# Patient Record
Sex: Female | Born: 1980 | Race: White | Hispanic: No | Smoking: Current every day smoker
Health system: Southern US, Community
[De-identification: ages and names within clinical notes are randomized; demographics above are authoritative.]

## PROBLEM LIST (undated history)

## (undated) DIAGNOSIS — F319 Bipolar disorder, unspecified: Secondary | ICD-10-CM

## (undated) DIAGNOSIS — F419 Anxiety disorder, unspecified: Secondary | ICD-10-CM

## (undated) DIAGNOSIS — J45909 Unspecified asthma, uncomplicated: Secondary | ICD-10-CM

## (undated) DIAGNOSIS — F32A Depression, unspecified: Secondary | ICD-10-CM

## (undated) DIAGNOSIS — F329 Major depressive disorder, single episode, unspecified: Secondary | ICD-10-CM

## (undated) HISTORY — DX: Depression, unspecified: F32.A

## (undated) HISTORY — DX: Bipolar disorder, unspecified: F31.9

## (undated) HISTORY — DX: Major depressive disorder, single episode, unspecified: F32.9

## (undated) HISTORY — DX: Anxiety disorder, unspecified: F41.9

---
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
## 2002-01-13 ENCOUNTER — Emergency Department (HOSPITAL_COMMUNITY): Admission: EM | Admit: 2002-01-13 | Discharge: 2002-01-13 | Payer: Self-pay

## 2004-01-22 ENCOUNTER — Other Ambulatory Visit: Admission: RE | Admit: 2004-01-22 | Discharge: 2004-01-22 | Payer: Self-pay | Admitting: Gynecology

## 2004-02-09 ENCOUNTER — Ambulatory Visit: Payer: Self-pay | Admitting: Psychiatry

## 2004-02-09 ENCOUNTER — Inpatient Hospital Stay (HOSPITAL_COMMUNITY): Admission: EM | Admit: 2004-02-09 | Discharge: 2004-02-13 | Payer: Self-pay | Admitting: Psychiatry

## 2004-04-22 ENCOUNTER — Ambulatory Visit (HOSPITAL_COMMUNITY): Payer: Self-pay | Admitting: Professional Counselor

## 2004-05-29 ENCOUNTER — Ambulatory Visit (HOSPITAL_COMMUNITY): Payer: Self-pay | Admitting: Psychiatry

## 2004-12-24 ENCOUNTER — Emergency Department (HOSPITAL_COMMUNITY): Admission: EM | Admit: 2004-12-24 | Discharge: 2004-12-24 | Payer: Self-pay | Admitting: Emergency Medicine

## 2005-02-22 ENCOUNTER — Emergency Department (HOSPITAL_COMMUNITY): Admission: EM | Admit: 2005-02-22 | Discharge: 2005-02-22 | Payer: Self-pay | Admitting: Emergency Medicine

## 2005-03-08 ENCOUNTER — Emergency Department (HOSPITAL_COMMUNITY): Admission: EM | Admit: 2005-03-08 | Discharge: 2005-03-08 | Payer: Self-pay | Admitting: Emergency Medicine

## 2005-03-09 ENCOUNTER — Inpatient Hospital Stay (HOSPITAL_COMMUNITY): Admission: AD | Admit: 2005-03-09 | Discharge: 2005-03-09 | Payer: Self-pay | Admitting: *Deleted

## 2005-03-11 ENCOUNTER — Inpatient Hospital Stay (HOSPITAL_COMMUNITY): Admission: AD | Admit: 2005-03-11 | Discharge: 2005-03-11 | Payer: Self-pay | Admitting: *Deleted

## 2005-04-07 ENCOUNTER — Other Ambulatory Visit: Admission: RE | Admit: 2005-04-07 | Discharge: 2005-04-07 | Payer: Self-pay | Admitting: Obstetrics & Gynecology

## 2005-04-09 ENCOUNTER — Emergency Department (HOSPITAL_COMMUNITY): Admission: EM | Admit: 2005-04-09 | Discharge: 2005-04-10 | Payer: Self-pay | Admitting: Emergency Medicine

## 2005-04-12 ENCOUNTER — Other Ambulatory Visit: Admission: RE | Admit: 2005-04-12 | Discharge: 2005-04-12 | Payer: Self-pay | Admitting: Obstetrics & Gynecology

## 2005-05-03 ENCOUNTER — Inpatient Hospital Stay (HOSPITAL_COMMUNITY): Admission: AD | Admit: 2005-05-03 | Discharge: 2005-05-04 | Payer: Self-pay | Admitting: Obstetrics & Gynecology

## 2005-05-22 ENCOUNTER — Inpatient Hospital Stay (HOSPITAL_COMMUNITY): Admission: AD | Admit: 2005-05-22 | Discharge: 2005-05-23 | Payer: Self-pay | Admitting: Obstetrics and Gynecology

## 2005-07-04 ENCOUNTER — Inpatient Hospital Stay (HOSPITAL_COMMUNITY): Admission: AD | Admit: 2005-07-04 | Discharge: 2005-07-04 | Payer: Self-pay | Admitting: Obstetrics & Gynecology

## 2005-07-16 ENCOUNTER — Inpatient Hospital Stay (HOSPITAL_COMMUNITY): Admission: AD | Admit: 2005-07-16 | Discharge: 2005-07-16 | Payer: Self-pay | Admitting: Obstetrics and Gynecology

## 2005-09-15 ENCOUNTER — Inpatient Hospital Stay (HOSPITAL_COMMUNITY): Admission: AD | Admit: 2005-09-15 | Discharge: 2005-09-15 | Payer: Self-pay | Admitting: Obstetrics and Gynecology

## 2005-09-28 ENCOUNTER — Inpatient Hospital Stay (HOSPITAL_COMMUNITY): Admission: AD | Admit: 2005-09-28 | Discharge: 2005-09-29 | Payer: Self-pay | Admitting: Obstetrics and Gynecology

## 2005-10-28 ENCOUNTER — Inpatient Hospital Stay (HOSPITAL_COMMUNITY): Admission: AD | Admit: 2005-10-28 | Discharge: 2005-10-31 | Payer: Self-pay | Admitting: Obstetrics and Gynecology

## 2005-12-07 ENCOUNTER — Emergency Department (HOSPITAL_COMMUNITY): Admission: EM | Admit: 2005-12-07 | Discharge: 2005-12-07 | Payer: Self-pay | Admitting: Emergency Medicine

## 2006-08-27 ENCOUNTER — Emergency Department (HOSPITAL_COMMUNITY): Admission: EM | Admit: 2006-08-27 | Discharge: 2006-08-27 | Payer: Self-pay | Admitting: Emergency Medicine

## 2006-09-22 ENCOUNTER — Emergency Department (HOSPITAL_COMMUNITY): Admission: EM | Admit: 2006-09-22 | Discharge: 2006-09-22 | Payer: Self-pay | Admitting: Emergency Medicine

## 2006-09-26 ENCOUNTER — Emergency Department (HOSPITAL_COMMUNITY): Admission: EM | Admit: 2006-09-26 | Discharge: 2006-09-26 | Payer: Self-pay | Admitting: Emergency Medicine

## 2006-11-25 ENCOUNTER — Ambulatory Visit: Payer: Self-pay | Admitting: Internal Medicine

## 2006-12-16 ENCOUNTER — Ambulatory Visit: Payer: Self-pay | Admitting: Internal Medicine

## 2006-12-16 ENCOUNTER — Encounter (INDEPENDENT_AMBULATORY_CARE_PROVIDER_SITE_OTHER): Payer: Self-pay | Admitting: Nurse Practitioner

## 2006-12-16 LAB — CONVERTED CEMR LAB: Urinalysis: NORMAL

## 2006-12-30 ENCOUNTER — Encounter: Payer: Self-pay | Admitting: Nurse Practitioner

## 2006-12-30 DIAGNOSIS — M545 Low back pain: Secondary | ICD-10-CM

## 2007-01-02 DIAGNOSIS — B977 Papillomavirus as the cause of diseases classified elsewhere: Secondary | ICD-10-CM

## 2007-03-11 ENCOUNTER — Emergency Department (HOSPITAL_COMMUNITY): Admission: EM | Admit: 2007-03-11 | Discharge: 2007-03-11 | Payer: Self-pay | Admitting: Family Medicine

## 2007-03-30 ENCOUNTER — Emergency Department (HOSPITAL_COMMUNITY): Admission: EM | Admit: 2007-03-30 | Discharge: 2007-03-30 | Payer: Self-pay | Admitting: Emergency Medicine

## 2007-08-15 ENCOUNTER — Emergency Department (HOSPITAL_COMMUNITY): Admission: EM | Admit: 2007-08-15 | Discharge: 2007-08-16 | Payer: Self-pay | Admitting: Emergency Medicine

## 2007-08-26 ENCOUNTER — Emergency Department (HOSPITAL_COMMUNITY): Admission: EM | Admit: 2007-08-26 | Discharge: 2007-08-27 | Payer: Self-pay | Admitting: Emergency Medicine

## 2007-08-30 ENCOUNTER — Emergency Department (HOSPITAL_COMMUNITY): Admission: EM | Admit: 2007-08-30 | Discharge: 2007-08-30 | Payer: Self-pay | Admitting: Emergency Medicine

## 2007-09-26 ENCOUNTER — Ambulatory Visit (HOSPITAL_COMMUNITY): Admission: RE | Admit: 2007-09-26 | Discharge: 2007-09-26 | Payer: Self-pay | Admitting: Obstetrics & Gynecology

## 2007-09-29 ENCOUNTER — Inpatient Hospital Stay (HOSPITAL_COMMUNITY): Admission: AD | Admit: 2007-09-29 | Discharge: 2007-09-29 | Payer: Self-pay | Admitting: Obstetrics and Gynecology

## 2007-10-09 ENCOUNTER — Emergency Department (HOSPITAL_COMMUNITY): Admission: EM | Admit: 2007-10-09 | Discharge: 2007-10-09 | Payer: Self-pay | Admitting: Emergency Medicine

## 2008-04-09 ENCOUNTER — Inpatient Hospital Stay (HOSPITAL_COMMUNITY): Admission: AD | Admit: 2008-04-09 | Discharge: 2008-04-11 | Payer: Self-pay | Admitting: Obstetrics and Gynecology

## 2008-05-24 IMAGING — US US OB TRANSVAGINAL
1 series · 14 of 25 positions shown · non-contrast
Comparison: none

OBSTETRICAL ULTRASOUND:
 This ultrasound exam was performed in the [HOSPITAL] Ultrasound Department.  The OB US report was generated in the AS system, and faxed to the ordering physician.  This report is also available in [REDACTED] PACS.

[Series 1: us ob transvaginal · 0.18mm/px · 14 of 25 slices shown]
[im 1/25]
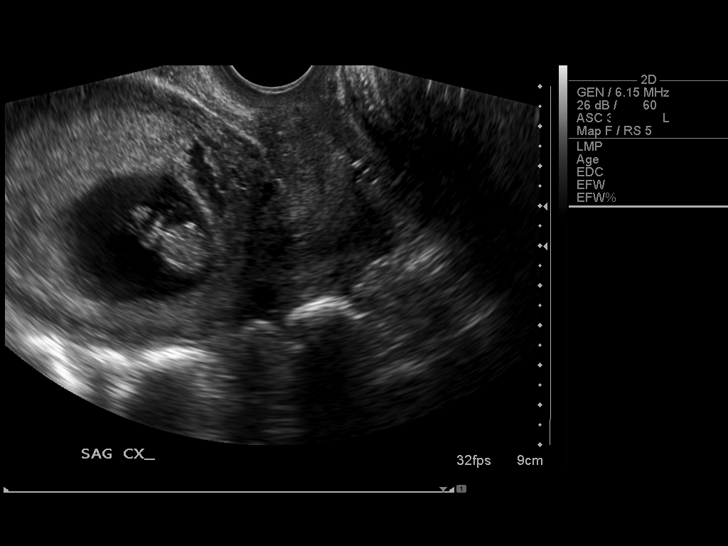
[im 3/25]
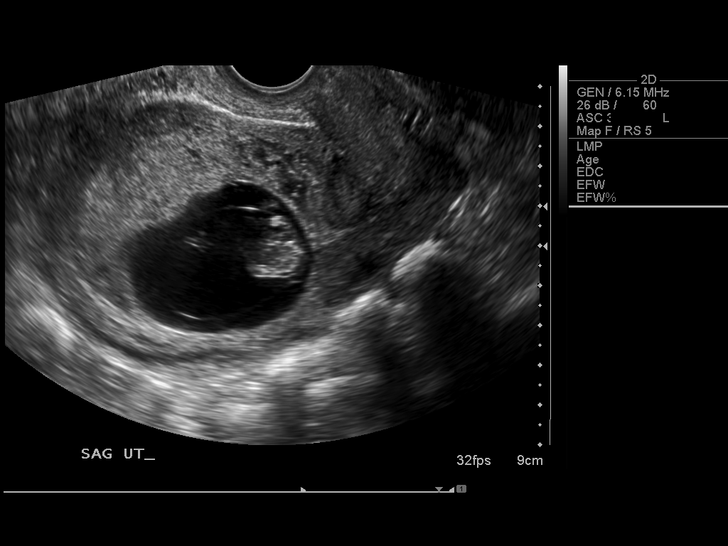
[im 5/25]
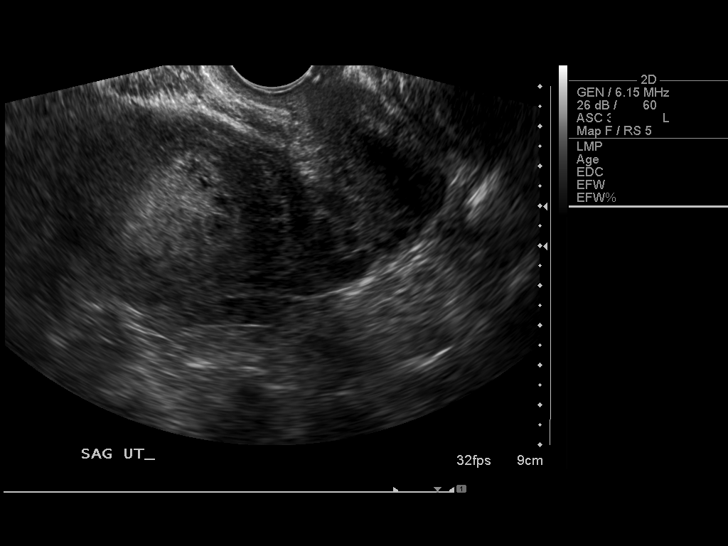
[im 7/25]
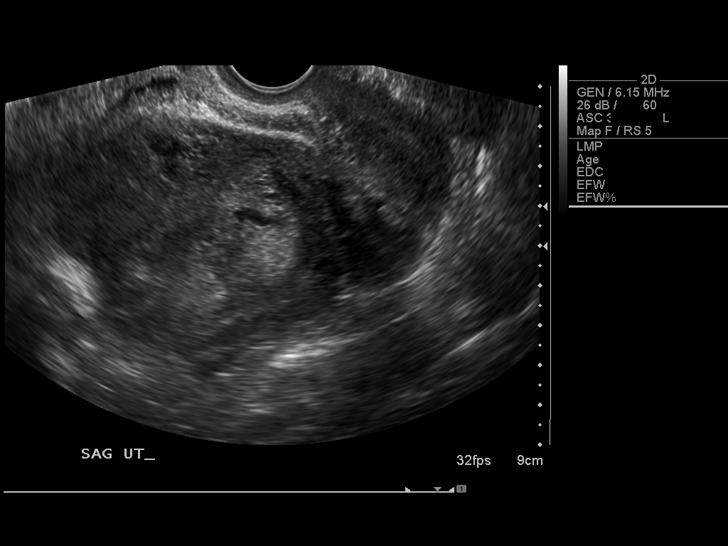
[im 9/25]
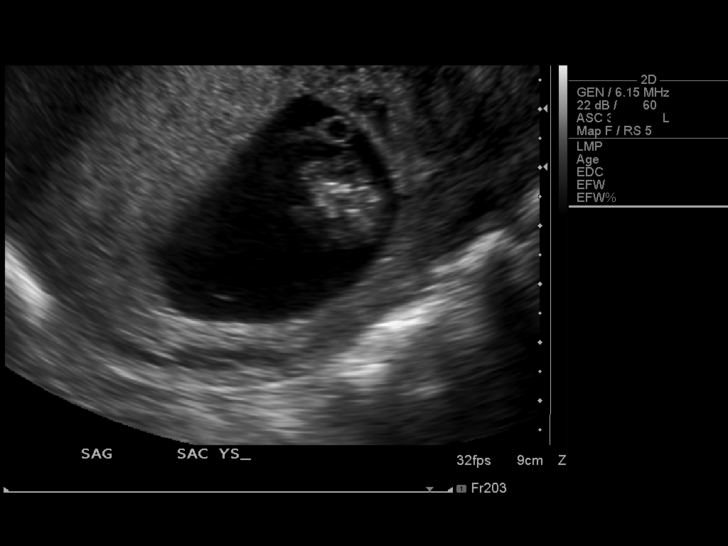
[im 10/25]
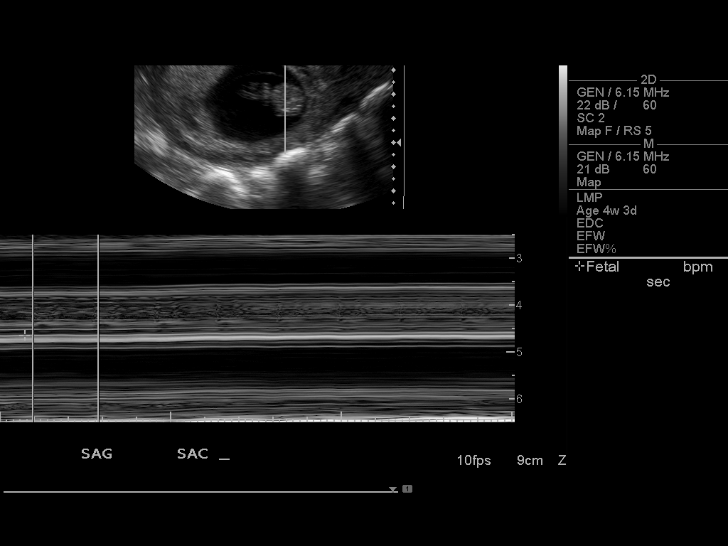
[im 12/25]
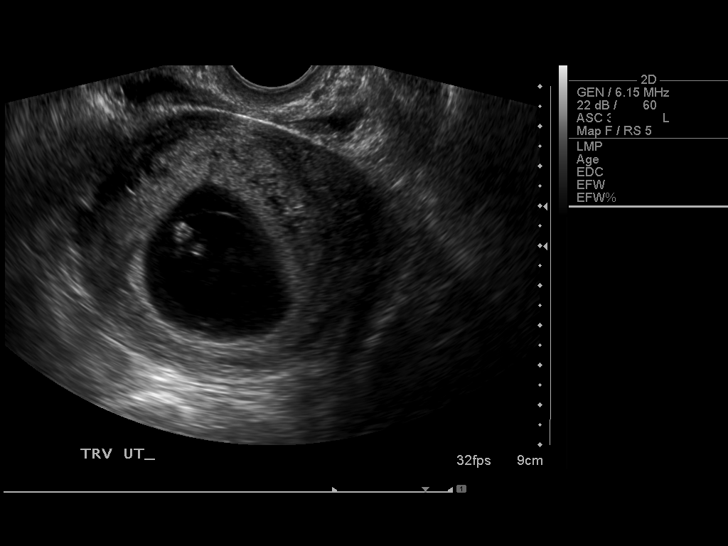
[im 14/25]
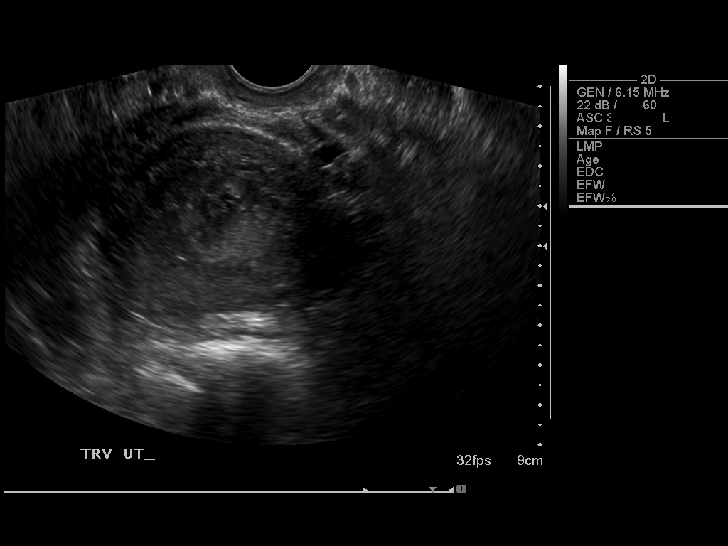
[im 16/25]
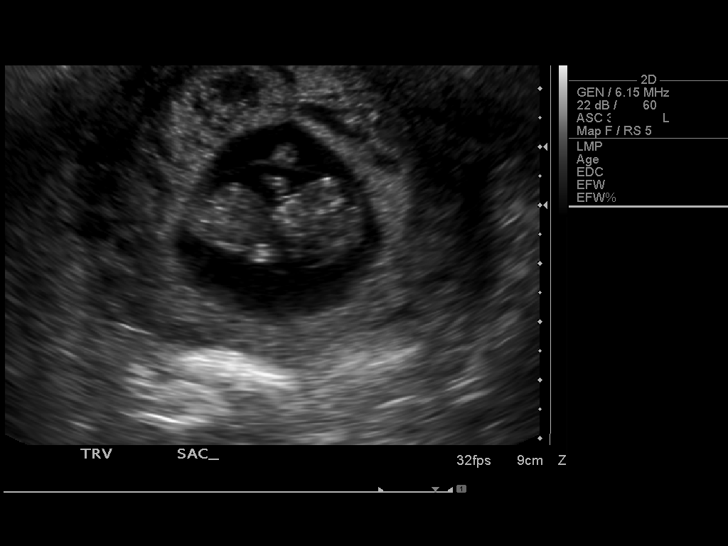
[im 17/25]
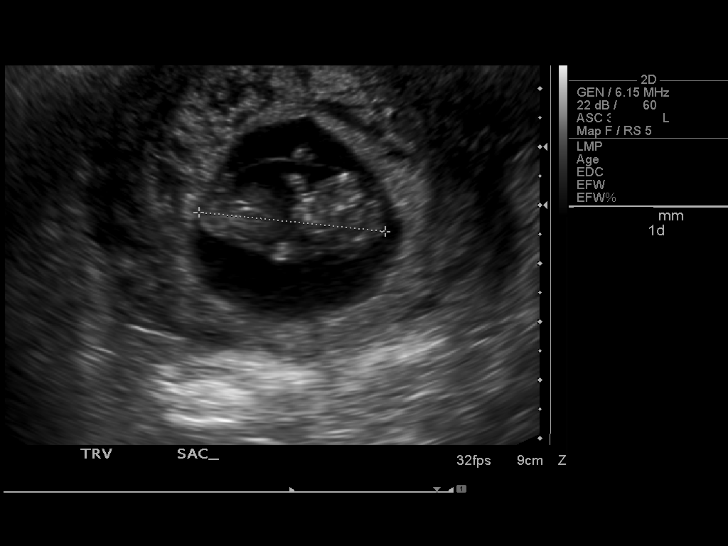
[im 19/25]
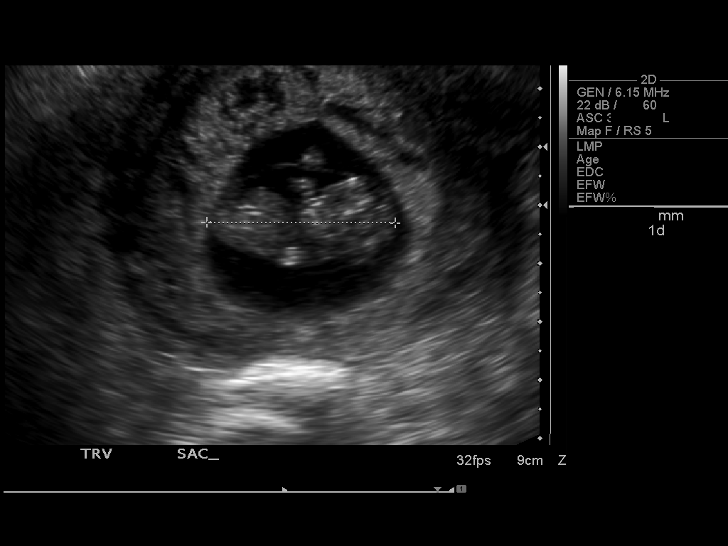
[im 21/25]
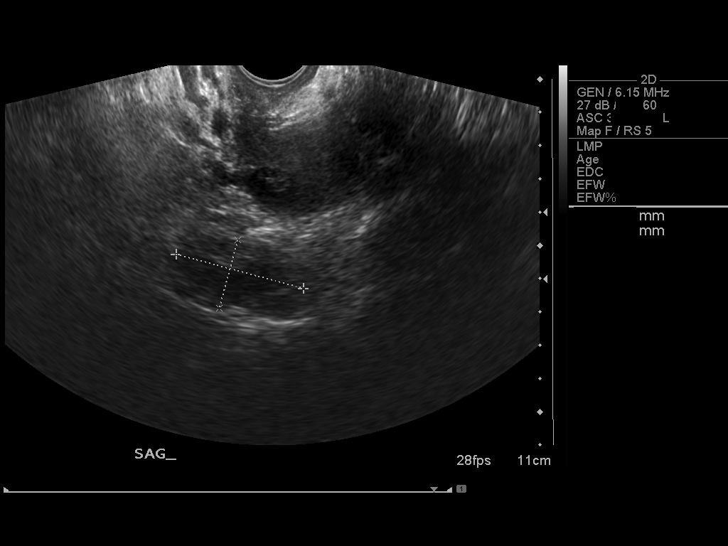
[im 23/25]
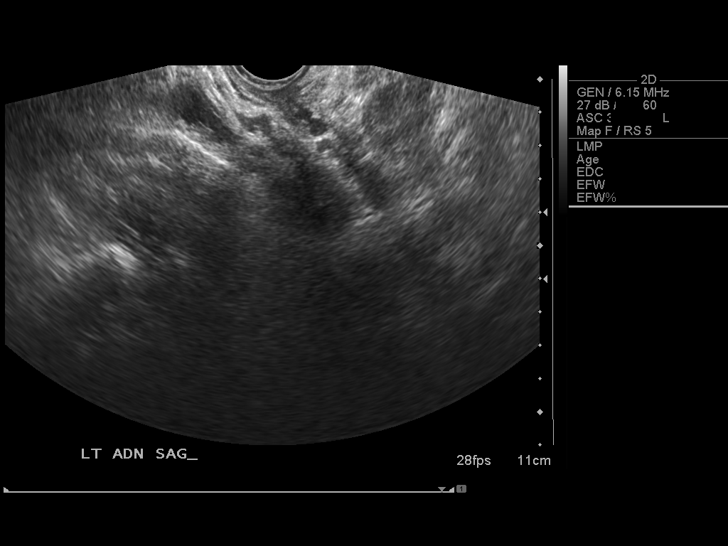
[im 25/25]
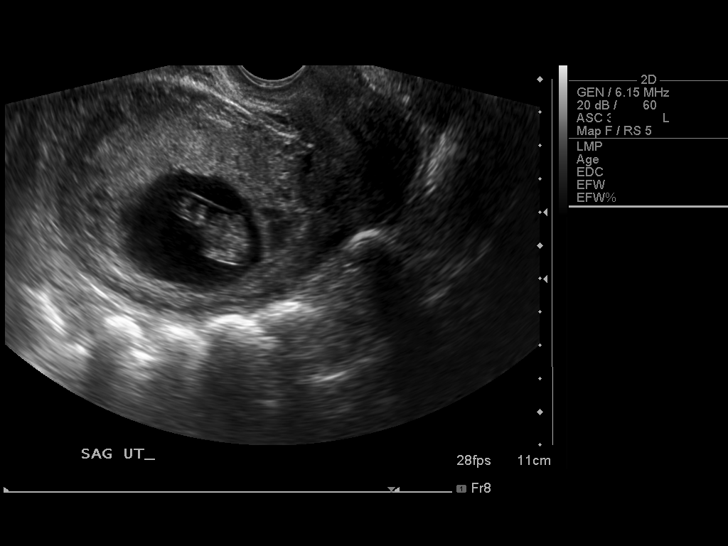

[14 of 25 positions shown; findings below may reference images not displayed]

IMPRESSION: See AS Obstetric US report.

## 2008-06-06 IMAGING — US US OB COMP LESS 14 WK
1 series · 14 of 28 positions shown · non-contrast
Comparison: Ultrasound 09/26/2006

CLINICAL DATA: Neck female status post fall with lower abdominal
pain

OBSTETRIC <14 WK ULTRASOUND
TECHNIQUE: Transabdominal ultrasound was performed for evaluation
of the gestation as well as the maternal uterus and adnexal
regions.

[Series 1: unknown · 0.32mm/px · 14 of 41 slices shown]
[im 2/41]
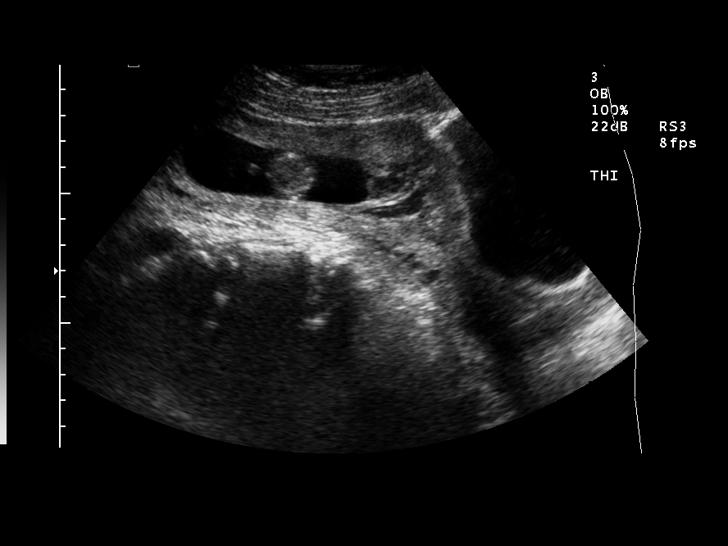
[im 5/41]
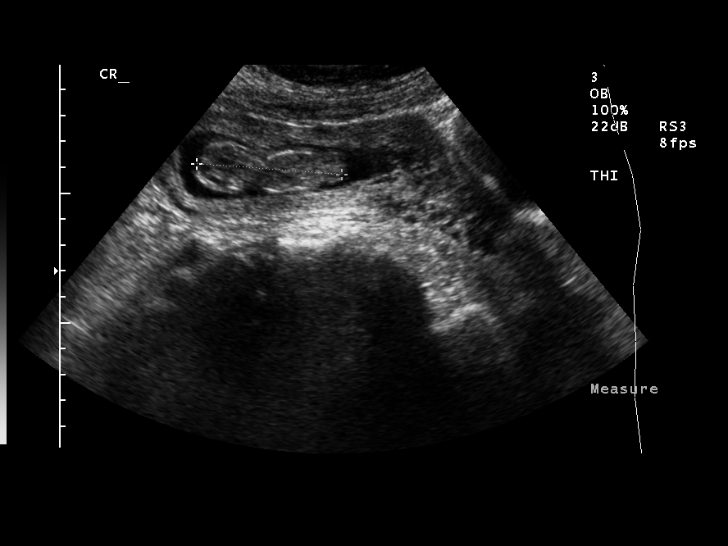
[im 8/41]
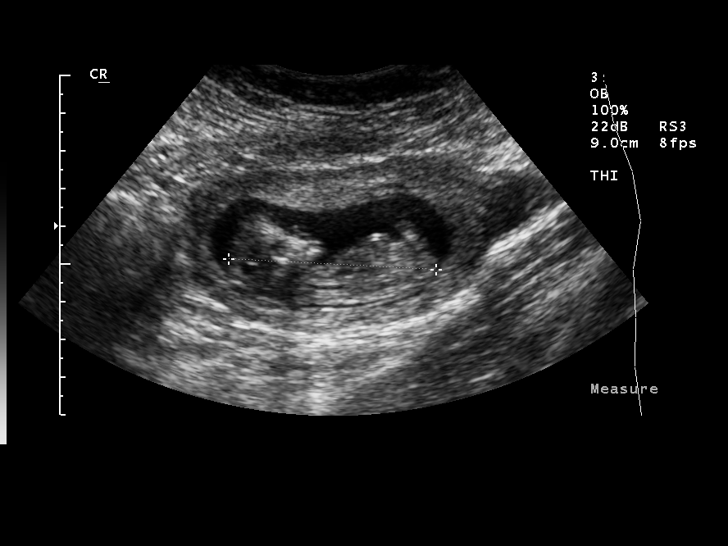
[im 11/41]
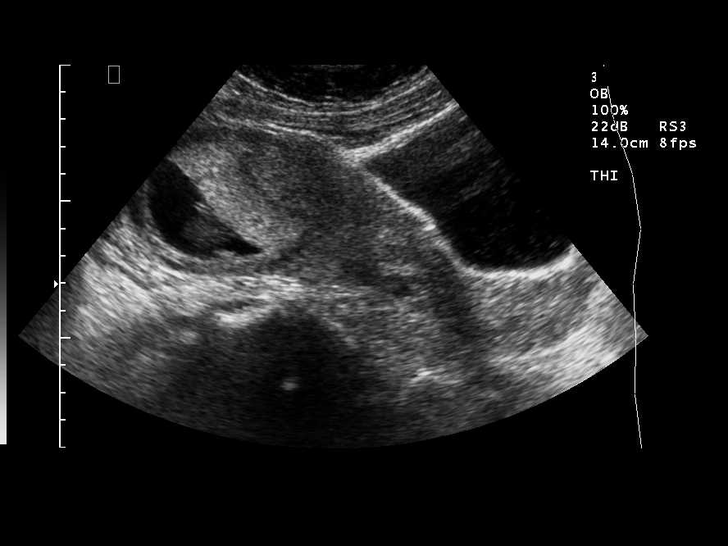
[im 14/41]
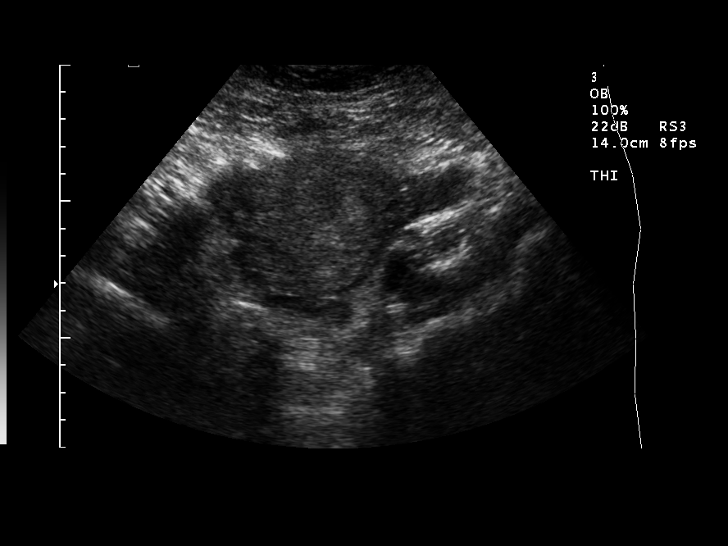
[im 17/41]
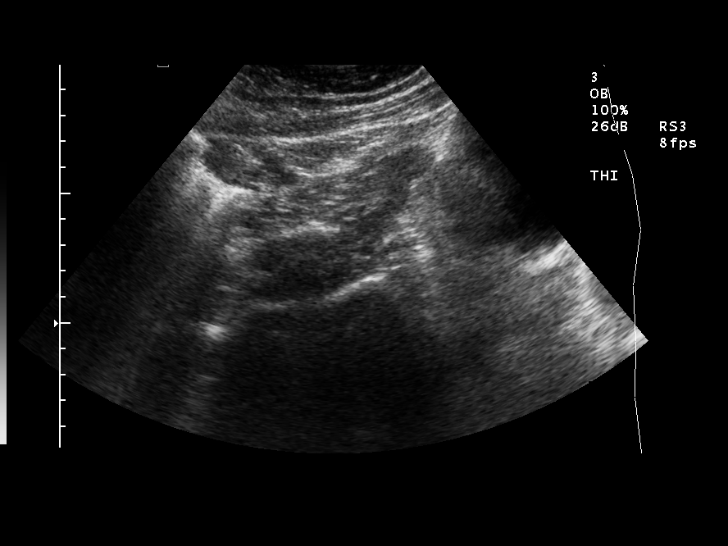
[im 20/41]
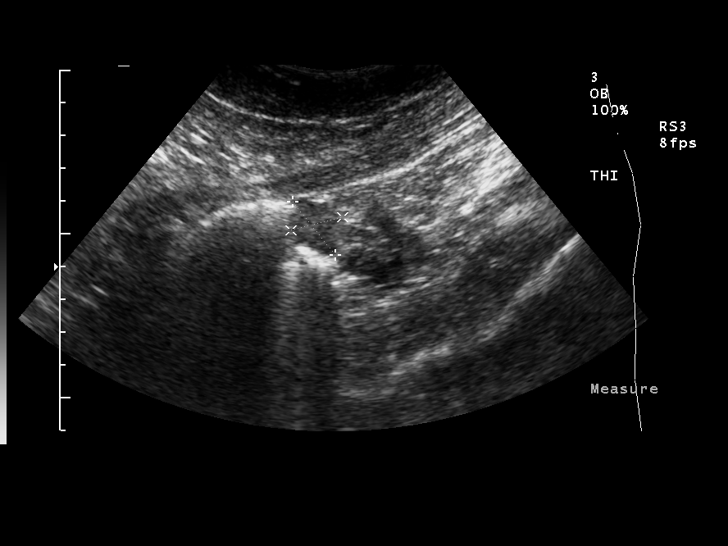
[im 23/41]
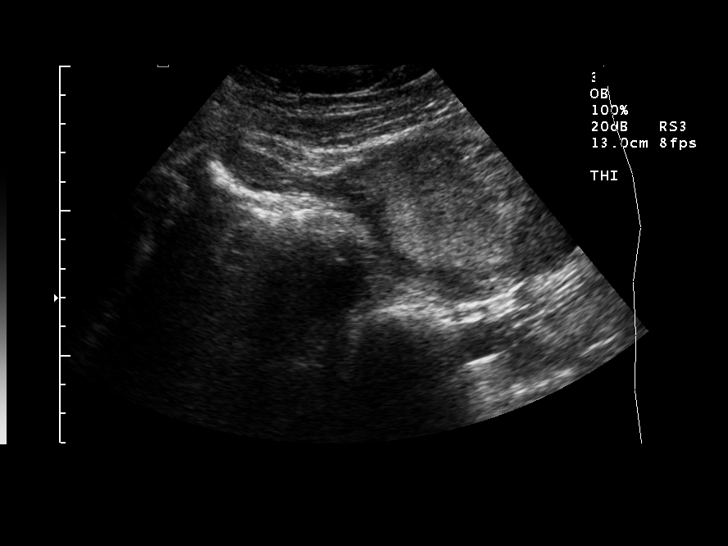
[im 26/41]
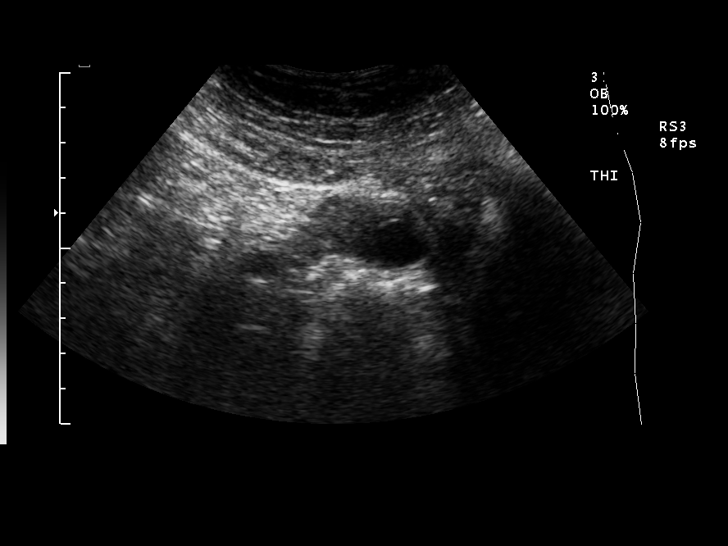
[im 29/41]
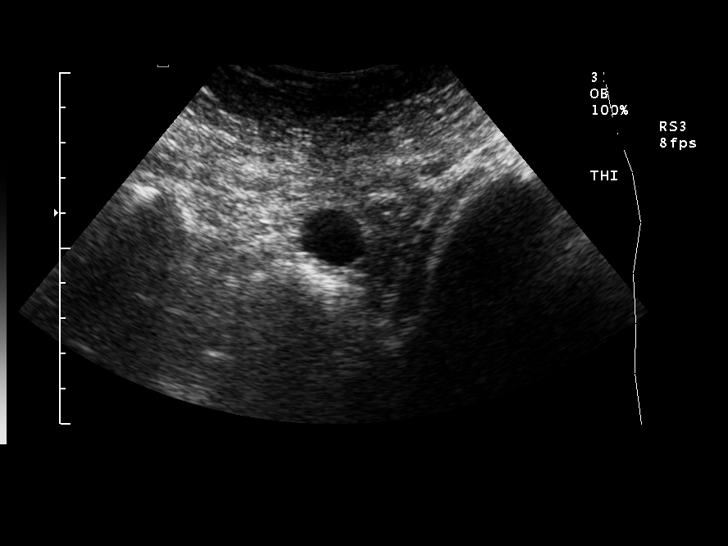
[im 32/41]
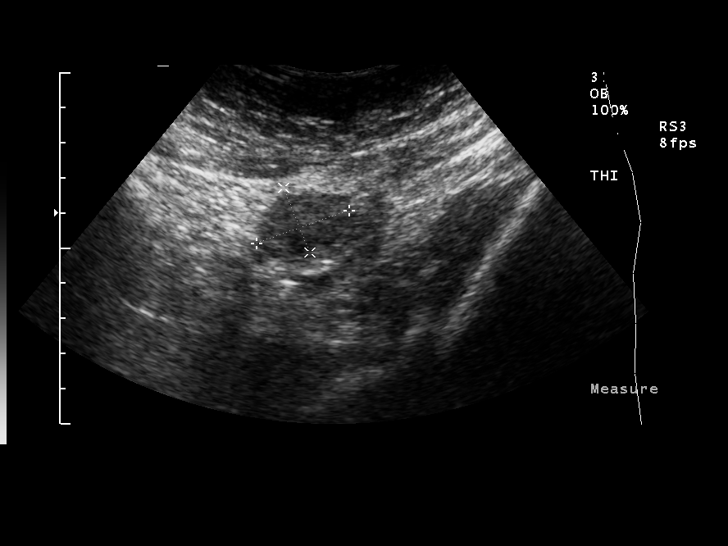
[im 35/41]
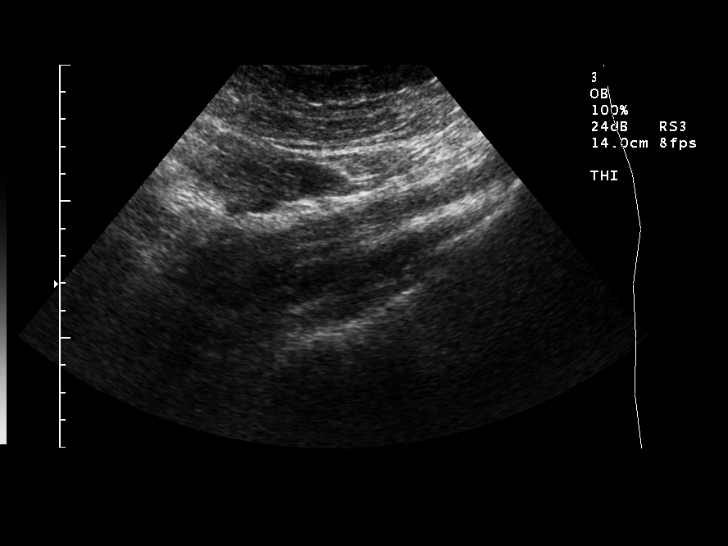
[im 38/41]
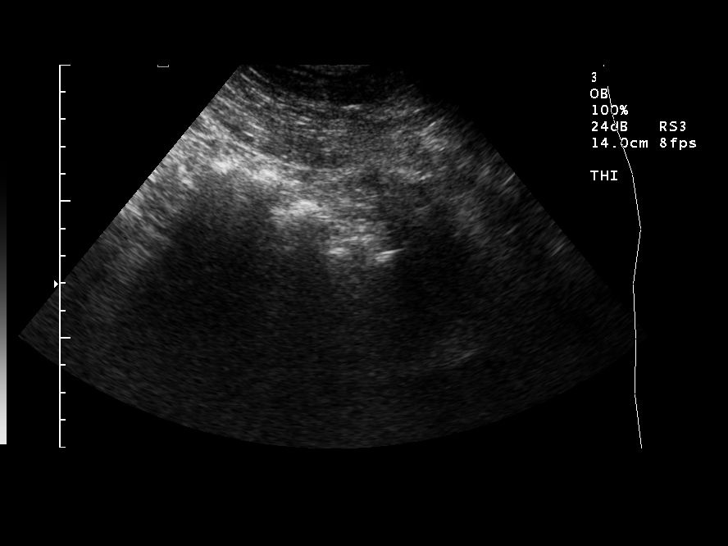
[im 41/41]
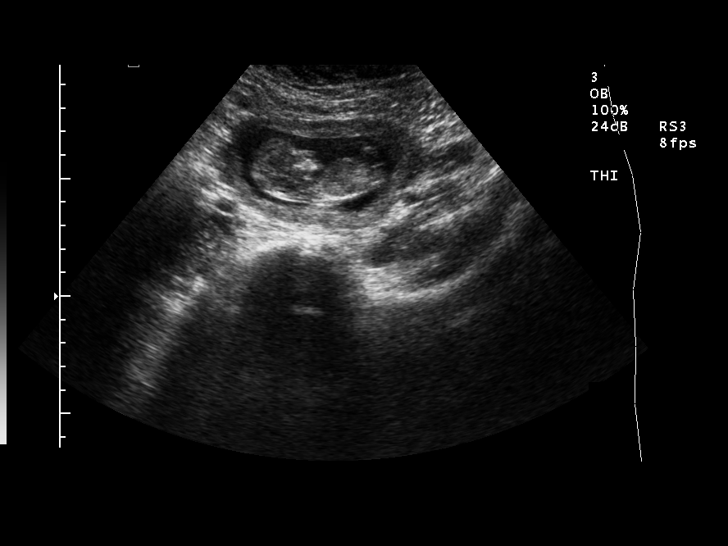

[14 of 28 positions shown; findings below may reference images not displayed]

FINDINGS: There is a single living intrauterine pregnancy with a
crown rump length of 5.5 cm for estimated gestational age of 12
weeks 1 day.  Heart rate is 160 beats per minute.  No subchorionic
hemorrhage is identified.  Right ovary is unremarkable.  Small left
ovarian cyst measure approximate 1.8 cm noted.  No pelvic fluid.
IMPRESSION: Single living intrauterine pregnancy without complicating features.

## 2010-06-07 ENCOUNTER — Encounter: Payer: Self-pay | Admitting: Obstetrics & Gynecology

## 2010-10-02 NOTE — Discharge Summary (Signed)
NAMELANGLEY, FLATLEY NO.:  [id number]   MEDICAL RECORD NO.:  [id number]          PATIENT TYPE:  IPS   LOCATION:  0303                          FACILITY:  [facility]   PHYSICIAN:  Geoffery Lyons, M.D.      DATE OF BIRTH:  May 03, 1981   DATE OF ADMISSION:  02/09/2004  DATE OF DISCHARGE:  02/13/2004                                 DISCHARGE SUMMARY   CHIEF COMPLAINT AND PRESENT ILLNESS:  This was the first admission to [facility] for this 30 year old single white female voluntarily  admitted.  History of depression, mood problems, hearing voices, felt  hopeless, depressed, chronic conflict with the mother with whom she lives.  Also endorsed that a friend killed self via a gunshot wound two weeks prior  to this admission.  Endorsed racing thoughts, voices hammering her and some  commands to cut.  Decreased sleep, two hours a night for three weeks,  irritable, agitated thoughts.   PAST PSYCHIATRIC HISTORY:  Sees Dr. Mila Homer.  First time at [facility].  Had been in the adolescent unit.  History of suicide attempts but cutting.   MEDICATIONS:  Paxil, Wellbutrin, Celexa, Lexapro, lithium, Seroquel with  poor compliance or side effects or lack of effectiveness.   ALCOHOL/DRUG HISTORY:  Cocaine and methamphetamines.  Clean for 1-1/2 years.   PAST MEDICAL HISTORY:  Menorrhea, headaches, bacterial vaginal infection.   MEDICATIONS:  Lexapro 10 mg per day.   PHYSICAL EXAMINATION:  Performed and failed to show any acute findings.   LABORATORY DATA:  CBC with white blood cells 14.4, hemoglobin 14.8.  Blood  chemistry within normal limits.  TSH 1.013.  Drug screen negative for  substances of abuse.   MENTAL STATUS EXAM:  Fully alert, pleasant, cooperative female with somewhat  blunted affect.  Constricted but appropriate.  Speech normal rate, tempo and  production.  Mood depressed.  Thought processes logical, coherent and  relevant.  Suicidal ideation with  thoughts of cutting.  Positive for  auditory hallucinations, commands.  Promises safety.  Cognition was well-  preserved.   ADMISSION DIAGNOSES:   AXIS I:  Rule out bipolar disorder, depressed, with psychotic features.   AXIS II:  No diagnosis.   AXIS III:  Headaches.   AXIS IV:  Moderate.   AXIS V:  Global Assessment of Functioning upon admission 28; highest Global  Assessment of Functioning in the last year 69.   HOSPITAL COURSE:  She was admitted and started in individual and group  psychotherapy.  She was given Ambien for sleep and she was placed on  Risperdal 0.25 mg twice a day and she was given Klonopin 0.5 mg every six  hours as needed, Klonopin 1 mg at 9 p.m.  She was switched to Risperdal 0.25  mg at noon and at lunch and 0.5 mg at bedtime and to Klonopin 0.5 mg at  night.  She endorsed having a difficult time with light-headedness.  Had to  slide down to the floor.  Had not slept in few days before she came to the  unit.  The fact that she slept was making her feel a little better.  Endorsed chronic conflict with the mother or something to do with the death  of the friend.  Diagnosed with bipolar, recurrent, depression.  Discussed  options and we decided to pursue Wellbutrin XL 150 mg in the morning and  Lamictal 25 mg daily.  She endorsed starting to feel better.  Continued to  sleep better.  Was going to have a session with her mother in the evening  and she would like to see Dr. Katrinka Blazing for follow-up.  The session with the  mother went well.  Overall, her mood was better.  Her affect was bright,  broad.  There was no evidence of delusional ideas, suicidal ideation,  homicidal ideation.  She was feeling that she could go home safely.  As she  was indeed much better, we went ahead and discharged to outpatient follow-  up.   DISCHARGE DIAGNOSES:   AXIS I:  Bipolar disorder, depressed with psychotic features.   AXIS II:  No diagnosis.   AXIS III:  Headaches.    AXIS IV:  Moderate.   AXIS V:  Global Assessment of Functioning upon discharge 50.   DISCHARGE MEDICATIONS:  1.  Risperdal M-Tab 0.5 mg at night and 0.25 mg at noon.  2.  Klonopin 0.5 mg at bedtime.  3.  Wellbutrin XL 150 mg in the morning.  4.  Lamictal 25 mg, 1 daily.  5.  Ambien 10 mg at bedtime for sleep.   FOLLOW UP:  Dr. Milford Cage.     Farrel Gordon   IL/MEDQ  D:  03/03/2004  T:  03/04/2004  Job:  [id number]

## 2010-10-02 NOTE — Discharge Summary (Signed)
Nancy Hartman, Nancy Hartman                  ACCOUNT NO.:  [id number]   MEDICAL RECORD NO.:  [id number]          PATIENT TYPE:  INP   LOCATION:  9127                          FACILITY:  [facility]   PHYSICIAN:  Gerrit Friends. Aldona Bar, M.D.   DATE OF BIRTH:  1980-06-07   DATE OF ADMISSION:  10/28/2005  DATE OF DISCHARGE:  10/31/2005                                 DISCHARGE SUMMARY   DISCHARGE DIAGNOSIS:  1.  Term pregnancy delivered 6 pound female infant, Apgars 9 and 9.  2.  Blood type O+.   PROCEDURES:  1.  Normal spontaneous delivery.  2.  Vaginal tear repaired.   SUMMARY:  This 30 year old gravida 1, para 0 was admitted at term with  ruptured membranes.  She has a history of a bipolar disorder, but otherwise  her pregnancy was relatively uncomplicated.  She progressed in labor--  required Pitocin augmentation.  Requested and received an epidural; and  subsequent had a normal spontaneous delivery of viable 6 pound female infant  with good Apgars.  A small vaginal tear was repaired.   Her postpartum course was totally benign.  Her discharge hemoglobin was 10.2  with a white count 15,500 and a platelet count 252,000.  On the morning of  06/17 she was ambulating well, tolerating a regular diet well, having normal  bowel and bladder function and was afebrile.  Her breast-feeding was going  well; and she was desirous of discharge.  Accordingly, she was given  appropriate instructions and understood all instructions well.   DISCHARGE MEDICATIONS INCLUDE:  1.  Vitamins--1 a day.  2.  Feosol capsules--one 4-5x a week.  3.  Motrin 600 mg q.6 h. for pain or cramps.  4.  Tylox 1-2 q.4 h. for more severe pain.   She will return the office in 4 weeks for followup or as needed.   CONDITION ON DISCHARGE:  Improved.      Gerrit Friends. Aldona Bar, M.D.  Electronically Signed     RMW/MEDQ  D:  10/31/2005  T:  11/01/2005  Job:  [id number]

## 2011-02-08 LAB — URINALYSIS, ROUTINE W REFLEX MICROSCOPIC
Bilirubin Urine: NEGATIVE
Glucose, UA: NEGATIVE
Hgb urine dipstick: NEGATIVE
Ketones, ur: NEGATIVE
Nitrite: NEGATIVE
Protein, ur: NEGATIVE
Specific Gravity, Urine: 1.027
Urobilinogen, UA: 1
pH: 6.5

## 2011-02-08 LAB — POCT PREGNANCY, URINE
Operator id: 27065
Operator id: 27065
Preg Test, Ur: NEGATIVE
Preg Test, Ur: POSITIVE

## 2011-02-09 LAB — DIFFERENTIAL
Basophils Absolute: 0.1
Basophils Relative: 1
Eosinophils Absolute: 0.4
Eosinophils Absolute: 0.1
Eosinophils Relative: 3
Lymphs Abs: 2.8
Monocytes Relative: 3
Neutrophils Relative %: 82 — ABNORMAL HIGH

## 2011-02-09 LAB — WET PREP, GENITAL
Trich, Wet Prep: NONE SEEN
Yeast Wet Prep HPF POC: NONE SEEN

## 2011-02-09 LAB — URINE MICROSCOPIC-ADD ON

## 2011-02-09 LAB — CBC
HCT: 36.3
MCHC: 35
MCHC: 33.6
MCV: 83.8
MCV: 84.6
Platelets: 258
Platelets: 286
RDW: 14.7
WBC: 14.2 — ABNORMAL HIGH

## 2011-02-09 LAB — POCT I-STAT, CHEM 8
Glucose, Bld: 129 — ABNORMAL HIGH
HCT: 37
Hemoglobin: 12.6
Potassium: 4.1
Sodium: 139

## 2011-02-09 LAB — URINALYSIS, ROUTINE W REFLEX MICROSCOPIC
Bilirubin Urine: NEGATIVE
Glucose, UA: NEGATIVE
Hgb urine dipstick: NEGATIVE
Specific Gravity, Urine: 1.023
pH: 5.5

## 2011-02-09 LAB — HCG, QUANTITATIVE, PREGNANCY: hCG, Beta Chain, Quant, S: 11643 — ABNORMAL HIGH

## 2011-02-09 LAB — GC/CHLAMYDIA PROBE AMP, GENITAL
Chlamydia, DNA Probe: NEGATIVE
GC Probe Amp, Genital: NEGATIVE

## 2011-02-09 LAB — POCT CARDIAC MARKERS: CKMB, poc: <1 — ABNORMAL LOW

## 2011-02-09 LAB — ABO/RH: ABO/RH(D): O POS

## 2011-02-10 LAB — URINALYSIS, ROUTINE W REFLEX MICROSCOPIC
Bilirubin Urine: NEGATIVE
Hgb urine dipstick: NEGATIVE
Nitrite: NEGATIVE
Specific Gravity, Urine: 1.021
Specific Gravity, Urine: 1.02
Urobilinogen, UA: 0.2
pH: 6
pH: 5.5

## 2011-02-10 LAB — CBC
HCT: 35.8 — ABNORMAL LOW
Hemoglobin: 12.1
Hemoglobin: 12.1
MCHC: 34.5
MCV: 84.9
Platelets: 226
RBC: 4.14
WBC: 12.1 — ABNORMAL HIGH

## 2011-02-10 LAB — BASIC METABOLIC PANEL
BUN: 5 — ABNORMAL LOW
Calcium: 8.5
GFR calc non Af Amer: >60
Potassium: 3.5
Sodium: 137

## 2011-02-10 LAB — COMPREHENSIVE METABOLIC PANEL
BUN: 6
CO2: 27
Calcium: 9.1
Creatinine, Ser: 0.54
GFR calc non Af Amer: >60
Glucose, Bld: 93

## 2011-02-10 LAB — DIFFERENTIAL
Eosinophils Relative: 2
Lymphocytes Relative: 14
Lymphs Abs: 1.7
Neutro Abs: 9.6 — ABNORMAL HIGH

## 2011-02-10 LAB — WET PREP, GENITAL: Yeast Wet Prep HPF POC: NONE SEEN

## 2011-02-10 LAB — RPR: RPR Ser Ql: NONREACTIVE

## 2011-02-10 LAB — GC/CHLAMYDIA PROBE AMP, GENITAL: Chlamydia, DNA Probe: NEGATIVE

## 2011-02-10 LAB — HCG, QUANTITATIVE, PREGNANCY: hCG, Beta Chain, Quant, S: 56419 — ABNORMAL HIGH

## 2011-02-16 LAB — CBC
HCT: 33.3 — ABNORMAL LOW
MCHC: 34.3
MCHC: 34.4
MCV: 85.4
MCV: 85.5
Platelets: 222
Platelets: 248
RDW: 14.1
RDW: 14.2

## 2012-05-31 ENCOUNTER — Emergency Department (HOSPITAL_COMMUNITY)
Admission: EM | Admit: 2012-05-31 | Discharge: 2012-05-31 | Disposition: A | Discharge status: Home / Self Care | Payer: Medicaid Other | Attending: Emergency Medicine | Admitting: Emergency Medicine

## 2012-05-31 ENCOUNTER — Encounter (HOSPITAL_COMMUNITY): Payer: Self-pay | Admitting: *Deleted

## 2012-05-31 DIAGNOSIS — R059 Cough, unspecified: Secondary | ICD-10-CM | POA: Insufficient documentation

## 2012-05-31 DIAGNOSIS — F17200 Nicotine dependence, unspecified, uncomplicated: Secondary | ICD-10-CM | POA: Insufficient documentation

## 2012-05-31 DIAGNOSIS — J45909 Unspecified asthma, uncomplicated: Secondary | ICD-10-CM | POA: Insufficient documentation

## 2012-05-31 DIAGNOSIS — R6889 Other general symptoms and signs: Secondary | ICD-10-CM | POA: Insufficient documentation

## 2012-05-31 DIAGNOSIS — H6690 Otitis media, unspecified, unspecified ear: Secondary | ICD-10-CM | POA: Insufficient documentation

## 2012-05-31 DIAGNOSIS — J3489 Other specified disorders of nose and nasal sinuses: Secondary | ICD-10-CM | POA: Insufficient documentation

## 2012-05-31 DIAGNOSIS — R05 Cough: Secondary | ICD-10-CM | POA: Insufficient documentation

## 2012-05-31 HISTORY — DX: Unspecified asthma, uncomplicated: J45.909

## 2012-05-31 MED ORDER — ANTIPYRINE-BENZOCAINE 5.4-1.4 % OT SOLN: 3.0000 [drp] | OTIC | Status: DC | PRN

## 2012-05-31 MED ORDER — NAPROXEN 500 MG PO TABS: 500.0000 mg | ORAL_TABLET | Freq: Two times a day (BID) | ORAL | Status: DC

## 2012-05-31 MED ORDER — AMOXICILLIN 500 MG PO CAPS: 500.0000 mg | ORAL_CAPSULE | Freq: Three times a day (TID) | ORAL | Status: DC

## 2012-05-31 MED ORDER — ANTIPYRINE-BENZOCAINE 5.4-1.4 % OT SOLN
3.0000 [drp] | Freq: Once | OTIC | Status: AC
  Administered 2012-05-31: 4 [drp] via OTIC
  Filled 2012-05-31: qty 10

## 2012-05-31 NOTE — ED Notes (Signed)
MD at bedside. 

## 2012-05-31 NOTE — ED Notes (Signed)
Pt reports has had a head cold, but this am began to have right ear pain. Tried OTC ear drops without relief.

## 2012-05-31 NOTE — ED Provider Notes (Signed)
History     CSN: [id number] Arrival date & time 05/31/12  7846 First MD Initiated Contact with Patient 05/31/12 [phone]      Chief Complaint  Patient presents with  . Otalgia    HPI Pt started having pain in her ear this morning. She woke up and it was very painful.  She has had a cold for the last few days but was not having any trouble with her ear.  The pain is sharp and severe in the right ear.  She has not noticed any drainage from her ear.  The pain increases with coughing, and sneezing.  SHe did have a fever this morning as well.    Past Medical History  Diagnosis Date  . Asthma     History reviewed. No pertinent past surgical history.  No family history on file.  History  Substance Use Topics  . Smoking status: Current Every Day Smoker  . Smokeless tobacco: Not on file  . Alcohol Use: No    OB History    Grav Para Term Preterm Abortions TAB SAB Ect Mult Living                  Review of Systems  Constitutional: Positive for fever.  HENT: Positive for congestion.   All other systems reviewed and are negative.    Allergies  Diphenhydramine hcl; Food color red; and Vicodin  Home Medications  No current outpatient prescriptions on file.  Pulse 104  Temp 98.4 F (36.9 C) (Oral)  Resp 16  SpO2 96%  Physical Exam  Nursing note and vitals reviewed. Constitutional: She appears well-developed and well-nourished. No distress.  HENT:  Head: Normocephalic and atraumatic.  Right Ear: External ear normal. Tympanic membrane is injected, erythematous and bulging. A middle ear effusion is present.  Left Ear: Tympanic membrane and external ear normal. Tympanic membrane is not injected, not erythematous and not bulging.  Eyes: Conjunctivae normal are normal. Right eye exhibits no discharge. Left eye exhibits no discharge. No scleral icterus.  Neck: Neck supple. No tracheal deviation present.       No swelling noted  Cardiovascular: Normal rate.   Pulmonary/Chest:  Effort normal. No stridor. No respiratory distress.  Musculoskeletal: She exhibits no edema.  Neurological: She is alert. Cranial nerve deficit: no gross deficits.  Skin: Skin is warm and dry. No rash noted.  Psychiatric: She has a normal mood and affect.    ED Course  Procedures (including critical care time)  Labs Reviewed - No data to display No results found.   1. Otitis media     MDM  Patient appears to have an otitis media. No evidence of mastoiditis.  I will prescribe her amoxicillin as well as benzocaine and Naprosyn for her comfort. She has plans to follow up with a new primary doctor on Friday.        Celene Kras, MD 05/31/12 [phone]

## 2013-02-19 ENCOUNTER — Encounter (HOSPITAL_COMMUNITY): Payer: Self-pay | Admitting: Emergency Medicine

## 2013-02-19 ENCOUNTER — Emergency Department (HOSPITAL_COMMUNITY)
Admission: EM | Admit: 2013-02-19 | Discharge: 2013-02-19 | Disposition: A | Discharge status: Home / Self Care | Payer: Medicaid Other | Attending: Emergency Medicine | Admitting: Emergency Medicine

## 2013-02-19 DIAGNOSIS — Z79899 Other long term (current) drug therapy: Secondary | ICD-10-CM | POA: Insufficient documentation

## 2013-02-19 DIAGNOSIS — K0381 Cracked tooth: Secondary | ICD-10-CM | POA: Insufficient documentation

## 2013-02-19 DIAGNOSIS — Z91040 Latex allergy status: Secondary | ICD-10-CM | POA: Insufficient documentation

## 2013-02-19 DIAGNOSIS — F17200 Nicotine dependence, unspecified, uncomplicated: Secondary | ICD-10-CM | POA: Insufficient documentation

## 2013-02-19 DIAGNOSIS — J45909 Unspecified asthma, uncomplicated: Secondary | ICD-10-CM | POA: Insufficient documentation

## 2013-02-19 DIAGNOSIS — R600 Localized edema: Secondary | ICD-10-CM

## 2013-02-19 DIAGNOSIS — K055 Other periodontal diseases: Secondary | ICD-10-CM | POA: Insufficient documentation

## 2013-02-19 MED ORDER — PENICILLIN V POTASSIUM 500 MG PO TABS: 500.0000 mg | ORAL_TABLET | Freq: Three times a day (TID) | ORAL | Status: DC

## 2013-02-19 NOTE — ED Notes (Signed)
Pt c/o swelling to R side of face since waking up this morning. Pt denies dental pain. Pt states area just feels uncomfortable. Pt with no acute distress. Pt ambulatory to exam room with steady gait.

## 2013-02-19 NOTE — ED Provider Notes (Signed)
CSN: [id number]     Arrival date & time 02/19/13  1721 History   First MD Initiated Contact with Patient 02/19/13 1900    This chart was scribed for Kyung Bacca PA-C, a non-physician practitioner working with No att. providers found by Lewanda Rife, ED Scribe. This patient was seen in room WTR8/WTR8 and the patient's care was started at 7:50 PM     Chief Complaint  Patient presents with  . Facial Swelling   (Consider location/radiation/quality/duration/timing/severity/associated sxs/prior Treatment) The history is provided by the patient. No language interpreter was used.   HPI Comments: Nancy Hartman is a 32 y.o. female who presents to the Emergency Department complaining of worsening mild below right lower lip swelling onset last night. Reports mild discomfort that she attributes to the pressure from swelling.  There is a cracked lower right tooth, but this occurred several months ago.. Reports pain is alleviated by rubbing it. Reports pain is exacerbated by eating and smiling. Denies drainage, dental pain, fever, gingival pain, and recent injury. Denies taking any pain medications to treat pain.   Past Medical History  Diagnosis Date  . Asthma    History reviewed. No pertinent past surgical history. No family history on file. History  Substance Use Topics  . Smoking status: Current Every Day Smoker  . Smokeless tobacco: Not on file  . Alcohol Use: No   OB History   Grav Para Term Preterm Abortions TAB SAB Ect Mult Living                 Review of Systems  HENT: Positive for facial swelling.   All other systems reviewed and are negative.   A complete 10 system review of systems was obtained and all systems are negative except as noted in the HPI and PMHx.    Allergies  Diphenhydramine hcl; Food color red; Latex; and Vicodin  Home Medications   Current Outpatient Rx  Name  Route  Sig  Dispense  Refill  . ibuprofen (ADVIL,MOTRIN) 200 MG tablet   Oral  Take 400 mg by mouth every 6 (six) hours as needed for pain.         . medroxyPROGESTERone (DEPO-PROVERA) 150 MG/ML injection   Intramuscular   Inject 150 mg into the muscle every 3 (three) months. August 2014          BP 118/68  Pulse 96  Temp(Src) 98.8 F (37.1 C) (Oral)  Resp 16  SpO2 100% Physical Exam  Nursing note and vitals reviewed. Constitutional: She is oriented to person, place, and time. She appears well-developed and well-nourished. No distress.  HENT:  Head: Normocephalic and atraumatic.  Mouth/Throat: Uvula is midline and oropharynx is clear and moist. No trismus in the jaw. No posterior oropharyngeal edema, posterior oropharyngeal erythema or tonsillar abscesses.  No gingival discoloration. Edema of gingiva adjacent to right lower canine to 1 molar. Mildly TTP. No drainable abscess. Rennis Harding type 1 fracture of right lower canine. Mild edema along right anterior mandible. No skin changes.  No other mouth changes.      Eyes: EOM are normal.  Normal appearance  Neck: Normal range of motion. Neck supple. No tracheal deviation present.  Cardiovascular: Normal rate and regular rhythm.   Pulmonary/Chest: Effort normal and breath sounds normal. No respiratory distress.  Musculoskeletal: Normal range of motion.  Lymphadenopathy:    She has no cervical adenopathy.  Neurological: She is alert and oriented to person, place, and time.  Skin: Skin is warm and dry.  No rash noted.  Psychiatric: She has a normal mood and affect. Her behavior is normal.    ED Course  Procedures (including critical care time) DIAGNOSTIC STUDIES: Oxygen Saturation is 100% on room air, normal by my interpretation.    COORDINATION OF CARE:  8:13 PM-Discussed treatment plan with pt at bedside to treat this as an abscess and prescribe antibiotics and f/u with dentist. Pt agrees with plan. Comfortable with discharge and discussed return precautions.      Medications - No data to display  Labs  Review Labs Reviewed - No data to display Imaging Review No results found.  MDM   1. Facial edema    Healthy 32yo F presents w/ edema of gingiva adjacent to right lower canine-1st molar.  Possible periapical abscess, though she is afebrile, their is minimal localized tenderness and no adenopathy.  No drainable abscess.  Offered CT maxillofacial for further evaluation vs. following up with her dentist this week and she chose to see her dentist.  Started on penicillin empirically.  Advised her to return if her pain/swelling increases and she is unable to get an appointment.   I personally performed the services described in this documentation, which was scribed in my presence. The recorded information has been reviewed and is accurate.    Otilio Miu, PA-C 02/20/13 1557

## 2013-02-23 NOTE — ED Provider Notes (Signed)
Medical screening examination/treatment/procedure(s) were performed by non-physician practitioner and as supervising physician I was immediately available for consultation/collaboration.  Binyamin Nelis R. Mehul Rudin, MD 02/23/13 1017 

## 2013-06-09 ENCOUNTER — Other Ambulatory Visit: Payer: Self-pay | Admitting: Obstetrics

## 2013-06-11 NOTE — Telephone Encounter (Signed)
Please Review

## 2014-01-28 ENCOUNTER — Other Ambulatory Visit: Payer: Self-pay | Admitting: Obstetrics

## 2014-01-29 NOTE — Telephone Encounter (Signed)
Please advise on refill.

## 2014-01-31 ENCOUNTER — Telehealth: Payer: Self-pay | Admitting: *Deleted

## 2014-01-31 NOTE — Telephone Encounter (Signed)
Patient called stating her pharmacy had sent over a refill request for her birth control and that they had notified her that she needed to contact the office.  CB: This morning. Patient notified that it was because she was over due for an Annual Exam and that we needed to schedule her for one before we could refill the medication. I asked patient what birth control she was taking. Patient states DEPO. I asked the patient who give her the injections. Patient states she gives them to herself. I asked the patient when she was due for her next injection. Patient states she is past due. Patient notified to hold on. Patient trasnferred to the front for an Appointment and notified that we would call her back regarding her medication. I spoke with Erskine Squibb and since there is no proof of refills on medication or medication being picked up from the pharmacy and since we do not usually allow patients to give their on injections and the patient is already past due, that we would wait until the patients annual exam appointment before sending any refills.   Patient called again and asked that we call [phone]. CB: This evening, LM on VM to CB tomorrow.

## 2014-02-01 NOTE — Telephone Encounter (Signed)
Patient has been doing her own injections. (She states she has discussed this with Dr Clearance Coots.) Patient was due her injection last month. Patient states he was on OCP when she got pregnant. Patient states he has not been sexually active for 2 weeks and an OTC UPT was negative. Patient would like to continue to give herself her injections. Told patient I would check with her provider to see if we can call her medication in next week- but she would definitely need to abstain from intercourse until I speak to her provider.

## 2014-02-01 NOTE — Telephone Encounter (Signed)
Cannot approve self injections of Depo Provera.

## 2014-02-04 NOTE — Telephone Encounter (Signed)
Patient notified- she will speak to doctor at her appointment.

## 2014-02-28 ENCOUNTER — Ambulatory Visit: Payer: Self-pay | Admitting: Obstetrics

## 2014-03-04 ENCOUNTER — Ambulatory Visit (INDEPENDENT_AMBULATORY_CARE_PROVIDER_SITE_OTHER): Payer: Medicaid Other | Admitting: Obstetrics

## 2014-03-04 ENCOUNTER — Encounter: Payer: Self-pay | Admitting: Obstetrics

## 2014-03-04 VITALS — BP 123/81 | HR 108 | Temp 98.9°F | Ht 62.0 in | Wt 223.0 lb

## 2014-03-04 DIAGNOSIS — Z01419 Encounter for gynecological examination (general) (routine) without abnormal findings: Secondary | ICD-10-CM

## 2014-03-04 DIAGNOSIS — N946 Dysmenorrhea, unspecified: Secondary | ICD-10-CM

## 2014-03-04 DIAGNOSIS — Z0000 Encounter for general adult medical examination without abnormal findings: Secondary | ICD-10-CM

## 2014-03-04 DIAGNOSIS — Z3202 Encounter for pregnancy test, result negative: Secondary | ICD-10-CM

## 2014-03-04 DIAGNOSIS — Z30013 Encounter for initial prescription of injectable contraceptive: Secondary | ICD-10-CM

## 2014-03-04 LAB — POCT URINE PREGNANCY: Preg Test, Ur: NEGATIVE

## 2014-03-04 MED ORDER — MEDROXYPROGESTERONE ACETATE 150 MG/ML IM SUSP: INTRAMUSCULAR | Status: DC

## 2014-03-04 MED ORDER — NAPROXEN SODIUM 550 MG PO TABS: 550.0000 mg | ORAL_TABLET | Freq: Two times a day (BID) | ORAL | Status: DC

## 2014-03-04 NOTE — Progress Notes (Signed)
Subjective:     Nancy Hartman is a 33 y.o. female here for a routine exam.  Current complaints: none.    Personal health questionnaire:  Is patient Ashkenazi Jewish, have a family history of breast and/or ovarian cancer: no Is there a family history of uterine cancer diagnosed at age < 6850, gastrointestinal cancer, urinary tract cancer, family member who is a Personnel officerLynch syndrome-associated carrier: no Is the patient overweight and hypertensive, family history of diabetes, personal history of gestational diabetes or PCOS: no Is patient over 5555, have PCOS,  family history of premature CHD under age 31, diabetes, smoke, have hypertension or peripheral artery disease:  no At any time, has a partner hit, kicked or otherwise hurt or frightened you?: no Over the past 2 weeks, have you felt down, depressed or hopeless?: no Over the past 2 weeks, have you felt little interest or pleasure in doing things?:no   Gynecologic History No LMP recorded. Patient has had an injection. Contraception: Depo-Provera injections Last Pap: unknown. Results were: normal Last mammogram: n/a. Results were: n/a  Obstetric History OB History  Gravida Para Term Preterm AB SAB TAB Ectopic Multiple Living  2 2 2       2     # Outcome Date GA Lbr Len/2nd Weight Sex Delivery Anes PTL Lv  2 TRM     M SVD EPI  Y  1 TRM     F SVD EPI  Y      Past Medical History  Diagnosis Date  . Asthma     History reviewed. No pertinent past surgical history.  Current outpatient prescriptions:ibuprofen (ADVIL,MOTRIN) 200 MG tablet, Take 400 mg by mouth every 6 (six) hours as needed for pain., Disp: , Rfl: ;  medroxyPROGESTERone (DEPO-PROVERA) 150 MG/ML injection, INJECT INTRAMUSCULARLY EVERY 12 WEEKS FOR CONTRACEPTION, Disp: 1 mL, Rfl: 3;  naproxen sodium (ANAPROX DS) 550 MG tablet, Take 1 tablet (550 mg total) by mouth 2 (two) times daily with a meal., Disp: 30 tablet, Rfl: 5 Allergies  Allergen Reactions  . Diphenhydramine Hcl Hives     Red dye in benadryl  . Food Color Red Hives  . Latex Hives  . Vicodin [Hydrocodone-Acetaminophen] Nausea And Vomiting    History  Substance Use Topics  . Smoking status: Current Every Day Smoker  . Smokeless tobacco: Not on file  . Alcohol Use: No    History reviewed. No pertinent family history.    Review of Systems  Constitutional: negative for fatigue and weight loss Respiratory: negative for cough and wheezing Cardiovascular: negative for chest pain, fatigue and palpitations Gastrointestinal: negative for abdominal pain and change in bowel habits Musculoskeletal:negative for myalgias Neurological: negative for gait problems and tremors Behavioral/Psych: negative for abusive relationship, depression Endocrine: negative for temperature intolerance   Genitourinary:negative for abnormal menstrual periods, genital lesions, hot flashes, sexual problems and vaginal discharge Integument/breast: negative for breast lump, breast tenderness, nipple discharge and skin lesion(s)    Objective:       BP 123/81  Pulse 108  Temp(Src) 98.9 F (37.2 C)  Ht 5\' 2"  (1.575 m)  Wt 223 lb (101.152 kg)  BMI 40.78 kg/m2 General:   alert  Skin:   no rash or abnormalities  Lungs:   clear to auscultation bilaterally  Heart:   regular rate and rhythm, S1, S2 normal, no murmur, click, rub or gallop  Breasts:   normal without suspicious masses, skin or nipple changes or axillary nodes  Abdomen:  normal findings: no organomegaly, soft,  non-tender and no hernia  Pelvis:  External genitalia: normal general appearance Urinary system: urethral meatus normal and bladder without fullness, nontender Vaginal: normal without tenderness, induration or masses Cervix: normal appearance Adnexa: normal bimanual exam Uterus: anteverted and non-tender, normal size   Lab Review Urine pregnancy test is negative Labs reviewed yes Radiologic studies reviewed yes    Assessment:    Healthy female exam.     Contraceptive surveillance   Plan:    Education reviewed: low fat, low cholesterol diet, safe sex/STD prevention, self breast exams and weight bearing exercise. Contraception: Depo-Provera injections. Follow up in: 1 year.   Meds ordered this encounter  Medications  . medroxyPROGESTERone (DEPO-PROVERA) 150 MG/ML injection    Sig: INJECT INTRAMUSCULARLY EVERY 12 WEEKS FOR CONTRACEPTION    Dispense:  1 mL    Refill:  3  . naproxen sodium (ANAPROX DS) 550 MG tablet    Sig: Take 1 tablet (550 mg total) by mouth 2 (two) times daily with a meal.    Dispense:  30 tablet    Refill:  5   Orders Placed This Encounter  Procedures  . WET PREP BY MOLECULAR PROBE  . GC/Chlamydia Probe Amp  . POCT urine pregnancy

## 2014-03-05 LAB — GC/CHLAMYDIA PROBE AMP
CT PROBE, AMP APTIMA: NEGATIVE
GC PROBE AMP APTIMA: NEGATIVE

## 2014-03-05 LAB — WET PREP BY MOLECULAR PROBE
CANDIDA SPECIES: NEGATIVE
Gardnerella vaginalis: NEGATIVE
Trichomonas vaginosis: NEGATIVE

## 2014-03-06 LAB — PAP IG AND HPV HIGH-RISK: HPV DNA High Risk: NOT DETECTED

## 2014-03-18 ENCOUNTER — Encounter: Payer: Self-pay | Admitting: Obstetrics

## 2014-07-06 ENCOUNTER — Other Ambulatory Visit: Payer: Self-pay | Admitting: Obstetrics

## 2015-03-28 ENCOUNTER — Other Ambulatory Visit: Payer: Self-pay | Admitting: Obstetrics

## 2015-06-13 ENCOUNTER — Encounter (HOSPITAL_COMMUNITY): Payer: Self-pay | Admitting: Emergency Medicine

## 2015-06-13 ENCOUNTER — Emergency Department (HOSPITAL_COMMUNITY)
Admission: EM | Admit: 2015-06-13 | Discharge: 2015-06-13 | Disposition: A | Discharge status: Home / Self Care | Payer: Medicaid Other | Attending: Emergency Medicine | Admitting: Emergency Medicine

## 2015-06-13 DIAGNOSIS — X58XXXA Exposure to other specified factors, initial encounter: Secondary | ICD-10-CM | POA: Diagnosis not present

## 2015-06-13 DIAGNOSIS — Y9389 Activity, other specified: Secondary | ICD-10-CM | POA: Diagnosis not present

## 2015-06-13 DIAGNOSIS — F17200 Nicotine dependence, unspecified, uncomplicated: Secondary | ICD-10-CM | POA: Insufficient documentation

## 2015-06-13 DIAGNOSIS — Y9289 Other specified places as the place of occurrence of the external cause: Secondary | ICD-10-CM | POA: Insufficient documentation

## 2015-06-13 DIAGNOSIS — Y998 Other external cause status: Secondary | ICD-10-CM | POA: Diagnosis not present

## 2015-06-13 DIAGNOSIS — J45909 Unspecified asthma, uncomplicated: Secondary | ICD-10-CM | POA: Insufficient documentation

## 2015-06-13 DIAGNOSIS — S0341XA Sprain of jaw, right side, initial encounter: Secondary | ICD-10-CM | POA: Diagnosis not present

## 2015-06-13 DIAGNOSIS — Z91040 Latex allergy status: Secondary | ICD-10-CM | POA: Diagnosis not present

## 2015-06-13 DIAGNOSIS — K029 Dental caries, unspecified: Secondary | ICD-10-CM | POA: Diagnosis not present

## 2015-06-13 DIAGNOSIS — S0340XA Sprain of jaw, unspecified side, initial encounter: Secondary | ICD-10-CM

## 2015-06-13 DIAGNOSIS — Z791 Long term (current) use of non-steroidal anti-inflammatories (NSAID): Secondary | ICD-10-CM | POA: Insufficient documentation

## 2015-06-13 DIAGNOSIS — S0993XA Unspecified injury of face, initial encounter: Secondary | ICD-10-CM | POA: Diagnosis present

## 2015-06-13 MED ORDER — NAPROXEN 500 MG PO TABS: 500.0000 mg | ORAL_TABLET | Freq: Two times a day (BID) | ORAL | Status: AC

## 2015-06-13 MED ORDER — METHOCARBAMOL 500 MG PO TABS: 500.0000 mg | ORAL_TABLET | Freq: Two times a day (BID) | ORAL | Status: DC | PRN

## 2015-06-13 NOTE — ED Provider Notes (Signed)
CSN: [id number]     Arrival date & time 06/13/15  1721 History  By signing my name below, I, Phillis Haggis, attest that this documentation has been prepared under the direction and in the presence of [facility], PA-C. Electronically Signed: Phillis Haggis, ED Scribe. 06/13/2015. 5:48 PM.   Chief Complaint  Patient presents with  . Dental Pain   Patient is a 35 y.o. female presenting with tooth pain. The history is provided by the patient. No language interpreter was used.  Dental Pain Severity:  Mild Onset quality:  Sudden Duration:  1 day Timing:  Constant Progression:  Worsening Chronicity:  New Context: poor dentition   Ineffective treatments: penicillin and Mobic. Associated symptoms: no difficulty swallowing, no facial swelling and no fever   HPI Comments: Nancy Hartman is a 35 y.o. female who presents to the Emergency Department complaining of gradually worsening right jaw pain onset 2 days ago. 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 She reports hx of similar symptoms but never this severe. She states that the jaw pain radiates from the corner of her jaw to the front of her mouth, to her right ear, and down her neck. She states that she has not since experienced the jaw "popping" sensation. Pt has been taking Meloxicam and her boyfriend's leftover penicillin to no relief. She denies swelling or drainage from the teeth, fever, chills, difficulty swallowing, nausea, vomiting, chest pain, SOB, or headaches.   Past Medical History  Diagnosis Date  . Asthma    History reviewed. No pertinent past surgical history. History reviewed. No pertinent family history. Social History  Substance Use Topics  . Smoking status: Current Every Day Smoker  . Smokeless tobacco: None  . Alcohol Use: No   OB History     Gravida Para Term Preterm AB TAB SAB Ectopic Multiple Living   Review of Systems  Constitutional: Negative for fever and chills.  HENT: Positive for dental problem. Negative for facial swelling.        Right jaw pain  Respiratory: Negative for shortness of breath.   Cardiovascular: Negative for chest pain.  Gastrointestinal: Negative for nausea and vomiting.   Allergies  Diphenhydramine hcl; Food color red; Latex; and Vicodin  Home Medications   Prior to Admission medications   Medication Sig Start Date End Date Taking? Authorizing Provider  ibuprofen (ADVIL,MOTRIN) 200 MG tablet Take 400 mg by mouth every 6 (six) hours as needed for pain.    Historical Provider, MD  medroxyPROGESTERone (DEPO-PROVERA) 150 MG/ML injection INJECT INTRAMUSCULARLY EVERY 12 WEEKS FOR CONTRACEPTION 07/08/14   Brock Bad, MD  MedroxyPROGESTERone Acetate 150 MG/ML SUSY INJECT INTRAMUSCULARLY EVERY 12 WEEKS FOR CONTRACEPTION 03/30/15   Brock Bad, MD  methocarbamol (ROBAXIN) 500 MG tablet Take 1 tablet (500 mg total) by mouth 2 (two) times daily as needed for muscle spasms. 06/13/15   Chase Picket Natnael Biederman, PA-C  naproxen (NAPROSYN) 500 MG tablet Take 1 tablet (500 mg total) by mouth 2 (two) times daily. 06/13/15   Zykee Avakian Pilcher Ithzel Fedorchak, PA-C  naproxen sodium (ANAPROX DS) 550 MG tablet Take 1 tablet (550 mg total) by mouth 2 (two) times daily with a meal. 03/04/14   Brock Bad, MD  BP 127/88 mmHg  Pulse 89  Temp(Src) 98.2 F (36.8 C) (Oral)  Resp 17  SpO2 100% Physical Exam  Constitutional: She is oriented to person, place, and time. She appears well-developed and well-nourished. No distress.  Speaking in full sentences  HENT:  Head: Normocephalic and atraumatic.  Right Ear: Tympanic membrane, external ear and ear canal normal.  Left Ear: Tympanic membrane, external ear and ear canal normal.  Mouth/Throat: Oropharynx is clear and moist.  Jaw with normal ROM - no clicking  or popping. No mandible deviation. TTP of right TMJ. Dental cavities and poor oral dentition noted, no pain along right upper or lower teeth, midline uvula, oropharynx moist and clear, no abscess noted, no oropharyngeal erythema or edema, neck supple and no tenderness. No facial edema  Eyes: EOM are normal. Pupils are equal, round, and reactive to light.  Neck: Normal range of motion. Neck supple.  Cardiovascular: Normal rate, regular rhythm, normal heart sounds and intact distal pulses.  Exam reveals no gallop and no friction rub.   No murmur heard. Pulmonary/Chest: Effort normal and breath sounds normal.  Abdominal: Soft. There is no tenderness.  Musculoskeletal: Normal range of motion.  Neurological: She is alert and oriented to person, place, and time.  Skin: Skin is warm and dry. She is not diaphoretic.  Psychiatric: She has a normal mood and affect. Her behavior is normal.  Nursing note and vitals reviewed.   ED Course  Procedures (including critical care time) DIAGNOSTIC STUDIES: Oxygen Saturation is 100% on RA, normal by my interpretation.    COORDINATION OF CARE: 5:48 PM-Discussed treatment plan which includes follow up if symptoms worsen and naproxen with pt at bedside and pt agreed to plan.    Labs Review Labs Reviewed - No data to display  Imaging Review No results found. I have personally reviewed and evaluated these images and lab results as part of my medical decision-making.   EKG Interpretation None      MDM   Final diagnoses:  TMJ (sprain of temporomandibular joint), initial encounter   Nancy Hartman presents with TMJ pain. She has full ROM of the jaw - no clicking or popping - no mandible deviation. TTP of the right TMJ joint. Will treat with naproxen and short course of muscle relaxer. Symptomatic care instructions such as ice and rest discussed. Return precautions given. Patient provided with uptodate patient education handout.   I personally performed  the services described in this documentation, which was scribed in my presence. The recorded information has been reviewed and is accurate.  [facility] Jazmeen Axtell, PA-C 06/13/15 1809  Raeford Razor, MD 06/18/15 [phone]

## 2015-06-13 NOTE — Discharge Instructions (Signed)
1. Medications: Take naproxen as directed for pain and inflammation, take robaxin (muscle relaxer) as needed for pain, continue usual home medications 2. Treatment: rest, drink plenty of fluids, eat soft foods for the next 2-3 days.  3. Follow Up: Please follow up with your primary doctor in 5 days if symptoms have not improved for discussion of your diagnoses and further evaluation after today's visit; if you do not have a primary care doctor use the resource guide provided to find one; Please return to the ER for new or worsening symptoms, any additional concerns.    Emergency Department Resource Guide 1) Find a Doctor and Pay Out of Pocket Although you won't have to find out who is covered by your insurance plan, it is a good idea to ask around and get recommendations. You will then need to call the office and see if the doctor you have chosen will accept you as a new patient and what types of options they offer for patients who are self-pay. Some doctors offer discounts or will set up payment plans for their patients who do not have insurance, but you will need to ask so you aren't surprised when you get to your appointment.  2) Contact Your Local Health Department Not all health departments have doctors that can see patients for sick visits, but many do, so it is worth a call to see if yours does. If you don't know where your local health department is, you can check in your phone book. The CDC also has a tool to help you locate your state's health department, and many state websites also have listings of all of their local health departments.  3) Find a Walk-in Clinic If your illness is not likely to be very severe or complicated, you may want to try a walk in clinic. These are popping up all over the country in pharmacies, drugstores, and shopping centers. They're usually staffed by nurse practitioners or physician assistants that have been trained to treat common illnesses and complaints.  They're usually fairly quick and inexpensive. However, if you have serious medical issues or chronic medical problems, these are probably not your best option.  No Primary Care Doctor: - Call Health Connect at  [phone] - they can help you locate a primary care doctor that  accepts your insurance, provides certain services, etc. - Physician Referral Service- [phone]  Chronic Pain Problems: Organization         Address  Phone   Notes  [facility]  [phone] Patients need to be referred by their primary care doctor.   Medication Assistance: Organization         Address  Phone   Notes  [facility] Medication [facility] [address] [place] [place], [place] [address] [phone] --Must be a resident of [facility] -- Must have NO insurance coverage whatsoever (no Medicaid/ Medicare, etc.) -- The pt. MUST have a primary care doctor that directs their care regularly and follows them in the community   MedAssist  [phone]   [employer]  [phone]    Agencies that provide inexpensive medical care: Organization         Address  Phone   Notes  [facility]  [phone]   [facility]    [phone]   [facility] [address] [place], [place] [address] [phone]   [facility] [address] [place]  [place] [phone]   Planned Parenthood    [phone]   [facility]    [phone]   [facility]  [address], [place] Phone:  [phone], Fax:  [phone] Hours of Operation:  9 am - 6 pm, M-F.  Also accepts Medicaid/Medicare and self-pay.  [facility]  [address], [place] Phone: [phone], Fax: [phone]. Hours of Operation:  8:30 am - 5:30 pm, M-F.  Also accepts Medicaid and self-pay.  [facility] [place] [address] [place]  Phone: [phone]   [facility] [address] Natasha Bence [place], [place] [phone], Ext. [phone] Mondays & Thursdays: 7-9 AM.  First 15 patients are seen on a first come, first serve basis.    Medicaid-accepting [facility] Providers:  Organization         Address  Phone   Notes  [facility] [address], [place] [phone] Also accepts self-pay patients.  [facility] [address] Laurell Josephs [place], [place]  [phone]   [facility] [address], [place] [phone]   [facility] [address], [place] [phone]   Renaye Rakers [address], [place]   [phone] Only accepts [place] Access [place] patients after they have their name applied to their card.   Self-Pay (no insurance) in [facility]:  Organization         Address  Phone   Notes  Sickle Cell Patients, [facility] Internal Medicine [address] [place], [place] [phone]   [facility] [address] [place], [place] [phone]   [facility] [place]  [address], [place] [phone]   [facility]/Dr. Osei-Bonsu  [address], [place] or [address], [place] [phone] Phone number for both [place] and [place] locations is the same.  [facility] and [facility] [address], [place] [phone]   [facility] [address], [place] or [address] [phone]   [facility] [address], [place] [phone], phone; [phone], fax Sees patients 1st and 3rd Saturday of every month.  Must not qualify for public or private insurance (i.e. Medicaid, Medicare, [place] Health Choice, Veterans' Benefits)  Household income should be no more than 200% of the poverty level The clinic cannot  treat you if you are pregnant or think you are pregnant  Sexually transmitted diseases are not treated at the clinic.    Dental Care: Organization         Address  Phone  Notes  [facility] Department of [facility] [address] [place], [place] [phone] Accepts children up to age 35 who are enrolled in [place] or [place] Health Choice; pregnant women with a Medicaid card; and children who have applied for Medicaid or [place] Health Choice, but were declined, whose parents can pay a reduced fee at time of service.  [facility] Department of [facility]  [address], [place] [phone] Accepts children up to age 1 who are enrolled in [place] or [place] Health Choice; pregnant women with a Medicaid card; and children who have applied for Medicaid or [place]  Health Choice, but were declined, whose parents can pay a reduced fee at time of service.  [facility]  [address] [place], [place] [phone] Patients are seen by appointment only. Walk-ins are not accepted. [facility] will see patients 66 years of age and older. Monday - Tuesday (8am-5pm) Most Wednesdays (8:30-5pm) $30 per visit, cash only  [facility] Adult Dental Access PRO[facility]  [address], [facility] [phone] Patients are seen by appointment only. Walk-ins are not accepted. [facility] will see patients 20 years of age and older. One Wednesday Evening (Monthly: Volunteer Based).  $30 per visit, cash only  [employer]  [phone] for adults; Children under age 110, call Graduate Pediatric Dentistry at [phone]. Children aged 19-14, please call [phone] to request a pediatric application.  Dental services are provided in all areas of dental care including fillings, crowns and bridges, complete and partial dentures, implants, gum treatment, root canals, and extractions. Preventive care is also provided.  Treatment is provided to both adults and children. Patients are selected via a lottery and there is often a waiting list.   [facility] [address] [place]  [phone] [email or web address]   [facility] [address] [place] [address] [place], [place] [phone], Ext. [phone] Second and Fourth Thursday of each month, opens at 6:30 AM; Clinic ends at 9 AM.  Patients are seen on a first-come first-served basis, and a limited number are seen during each clinic.   [facility]  [address] Ether Griffins [place], [place] [phone]   Eligibility Requirements You must have lived in [place], [place], or [place] counties for at least the last three months.   You cannot be eligible for state or federal sponsored [place], including [employer], [place], or [employer].   You generally cannot be eligible for healthcare insurance through your employer.    How to apply: Eligibility screenings are held every Tuesday and Wednesday afternoon from 1:00 pm until 4:00 pm. You do not need an appointment for the interview!  [facility] [address] [place], [place] [phone]   [facility] Health Department  [phone]   [facility] Health Department  [phone]   [facility] Health Department  [phone]    Behavioral Health Resources in the Community: Intensive Outpatient Programs Organization         Address  Phone  Notes  [facility] [place] [facility] [address], [place], [place] [phone]   [facility] Outpatient [address], [place], [place] [phone]   [facility] [address] [place], [place]  [phone]   [facility] Mental Health [address],  [place], [place] [phone] or [phone]   Substance Abuse Resources Organization         Address  Phone  Notes  [facility] Drug Services  [phone]   [facility]   [phone]   [facility] [place]  [phone]   Floydene Flock  [phone]   Residential & Outpatient Substance Abuse Program  [phone]   Psychological Services Organization         Address  Phone  Notes  [facility] Health  [phone]   [facility]  [phone]   [facility] Mental Health [address], [place] [phone] or [phone]    Mobile Crisis Teams Organization         Address  Phone  Notes  [facility], Mobile Crisis Care Unit  [phone]   [facility]  [address] [place], [place] [phone]   [facility] [address] [place] [phone]    Self-Help/Support Groups Organization         Address  Phone             Notes  [facility] [place] - variety of support groups  336- I7437963 Call for more information  Narcotics Anonymous (NA), [facility] [address], [employer] [place]  2 meetings at this location   Statistician         Address  Phone  Notes  [facility] [address],    [place]  [phone]   [facility]  [address] [place] [address], [place], [place] [phone]   [facility] [address] [place], [place] [phone] Admissions: 8am-3pm M-F  [facility] [address]    [place], [place] [phone]   [facility] [address] [place], [place], [place] [phone]   [facility] [address]  [place], [place] [phone]   [facility] - Intensive Outpatient [address] Laurell Joseph[address], [place], [place] [phone]   [facility] (Addiction [facility] [address] [place]  [place], [place] [phone] or [phone]   [facility] ([facility]) [address], [place], [place] [phone] Accepts Medicaid  [facility] [address],  [place] [phone] Substance  Abuse/Addiction Treatment   [facility] Organization         Address  Phone  Notes  [facility]  [phone]   Angie Fava, PhD [address] Ervin Knack [place], [place]   [phone] or [phone]   [facility] Behavioral   [address] [place], [place] [phone]   [facility] [address] [place], [place] [phone] Insurance/Medicaid/sponsorship through [facility] and Families [address]                                    [place], [place] [phone] Therapy/tele-psych/case   Hospital [address], [place] [phone]    Dr. Lolly Mustache  [phone]   [facility]  [employer] [facility] Dept. 1) [address], [place] 2) [address], [place] 3)  [address], [place] [phone]  [phone]   [facility] Child Abuse Hotline 9562768781 or [phone] [phone] (After Hours)

## 2015-06-13 NOTE — ED Notes (Signed)
Pt reports R jaw pain after yawning yesterday. Pt felt jaw pop at the time. Pt able to move jaw, but hurts with movement. Pain radiates to from corner of jaw to front of mouth and down neck. Pt also reports bad teeth on R side as well. Took 2 days worth of left over from boyfriend.

## 2015-07-23 ENCOUNTER — Encounter (HOSPITAL_COMMUNITY): Payer: Self-pay | Admitting: Emergency Medicine

## 2015-07-23 ENCOUNTER — Emergency Department (HOSPITAL_COMMUNITY)
Admission: EM | Admit: 2015-07-23 | Discharge: 2015-07-24 | Disposition: A | Discharge status: Home / Self Care | Payer: Medicaid Other | Attending: Emergency Medicine | Admitting: Emergency Medicine

## 2015-07-23 DIAGNOSIS — Z791 Long term (current) use of non-steroidal anti-inflammatories (NSAID): Secondary | ICD-10-CM | POA: Insufficient documentation

## 2015-07-23 DIAGNOSIS — R110 Nausea: Secondary | ICD-10-CM | POA: Diagnosis not present

## 2015-07-23 DIAGNOSIS — Z91040 Latex allergy status: Secondary | ICD-10-CM | POA: Insufficient documentation

## 2015-07-23 DIAGNOSIS — J45909 Unspecified asthma, uncomplicated: Secondary | ICD-10-CM | POA: Diagnosis not present

## 2015-07-23 DIAGNOSIS — R51 Headache: Secondary | ICD-10-CM | POA: Diagnosis present

## 2015-07-23 DIAGNOSIS — F17210 Nicotine dependence, cigarettes, uncomplicated: Secondary | ICD-10-CM | POA: Insufficient documentation

## 2015-07-23 DIAGNOSIS — H9209 Otalgia, unspecified ear: Secondary | ICD-10-CM | POA: Diagnosis not present

## 2015-07-23 DIAGNOSIS — J0101 Acute recurrent maxillary sinusitis: Secondary | ICD-10-CM | POA: Diagnosis not present

## 2015-07-23 NOTE — ED Notes (Signed)
Pt states she thinks she has a sinus infection  Pt states she has been blowing brown snot out of her nose and her nose has a bad smell in it  Pt c/o sinus pressure and feeling light headed

## 2015-07-23 NOTE — ED Provider Notes (Signed)
CSN: [id number]     Arrival date & time 07/23/15  2149 History  By signing my name below, I, Nancy Hartman, attest that this documentation has been prepared under the direction and in the presence of Nancy AnisShari Cletus Mehlhoff, PA-C. Electronically Signed: Gonzella LexKimberly Bianca Hartman, Scribe. 07/23/2015. 11:32 PM.   Chief Complaint  Patient presents with  . Facial Pain   The history is provided by the patient. No language interpreter was used.   HPI Comments: Nancy Hartman is a 35 y.o. female who presents to the Emergency Department complaining of sudden onset, gradually worsening, light headedness, nasal congestion, and drainage, which began two days ago. Pt also reports associated sinus pressure, a malodorous smell in her nasal cavity, and sneezing out of brown mucous and scabs. She also notes mild nausea, mild HA, a mild sore throat, mainly in the morning, which is resolved by drinking warm fluids, and mild ear pressure in her right ear, which can be relieved with the popping of her ear. She states that her daughter has recently had a cough but denies any other sick contacts. Pt also denies fever, chills, neck swelling, cough, and pain with drainage. Pt is allergic to benadryl.   Past Medical History  Diagnosis Date  . Asthma    History reviewed. No pertinent past surgical history. Family History  Problem Relation Age of Onset  . Diabetes Other    Social History  Substance Use Topics  . Smoking status: Current Every Day Smoker -- 0.50 packs/day    Types: Cigarettes  . Smokeless tobacco: None  . Alcohol Use: No   OB History    Gravida Para Term Preterm AB TAB SAB Ectopic Multiple Living   2 2 2       2      Review of Systems  Constitutional: Negative for fever and chills.  HENT: Positive for congestion, ear pain, postnasal drip, sinus pressure, sneezing and sore throat.        Negative for neck swelling  Respiratory: Negative for cough.   Gastrointestinal: Positive for nausea.   Neurological: Positive for light-headedness and headaches.    Allergies  Diphenhydramine hcl; Food color red; Latex; and Vicodin  Home Medications   Prior to Admission medications   Medication Sig Start Date End Date Taking? Authorizing Provider  ibuprofen (ADVIL,MOTRIN) 200 MG tablet Take 400 mg by mouth every 6 (six) hours as needed for pain.    Historical Provider, MD  medroxyPROGESTERone (DEPO-PROVERA) 150 MG/ML injection INJECT INTRAMUSCULARLY EVERY 12 WEEKS FOR CONTRACEPTION 07/08/14   Brock Badharles A Harper, MD  MedroxyPROGESTERone Acetate 150 MG/ML SUSY INJECT INTRAMUSCULARLY EVERY 12 WEEKS FOR CONTRACEPTION 03/30/15   Brock Badharles A Harper, MD  methocarbamol (ROBAXIN) 500 MG tablet Take 1 tablet (500 mg total) by mouth 2 (two) times daily as needed for muscle spasms. 06/13/15   Chase PicketJaime Pilcher Ward, PA-C  naproxen (NAPROSYN) 500 MG tablet Take 1 tablet (500 mg total) by mouth 2 (two) times daily. 06/13/15   Jaime Pilcher Ward, PA-C  naproxen sodium (ANAPROX DS) 550 MG tablet Take 1 tablet (550 mg total) by mouth 2 (two) times daily with a meal. 03/04/14   Brock Badharles A Harper, MD   BP 99/69 mmHg  Pulse 93  Temp(Src) 98.1 F (36.7 C) (Oral)  Resp 18  Ht 5\' 2"  (1.575 m)  Wt 189 lb (85.73 kg)  BMI 34.56 kg/m2  SpO2 99% Physical Exam  Constitutional: She is oriented to person, place, and time. She appears well-developed and well-nourished. No  distress.  HENT:  Head: Normocephalic and atraumatic.  Right Ear: Tympanic membrane normal.  Left Ear: Tympanic membrane normal.  Nose: Right sinus exhibits no maxillary sinus tenderness and no frontal sinus tenderness. Left sinus exhibits no maxillary sinus tenderness and no frontal sinus tenderness.  Eyes: Conjunctivae are normal.  Cardiovascular: Normal rate.   Pulmonary/Chest: Effort normal. She has no wheezes. She has no rales.  Abdominal: Soft. She exhibits no distension. There is no tenderness.  Neurological: She is alert and oriented to person,  place, and time. Coordination normal.  Skin: Skin is warm and dry.  Psychiatric: She has a normal mood and affect.  Nursing note and vitals reviewed.   ED Course  Procedures  DIAGNOSTIC STUDIES:    Oxygen Saturation is 99% on RA, normal by my interpretation.   COORDINATION OF CARE:  11:32 PM  Discussed treatment plan with pt at bedside and pt agreed to plan.   Labs Review Labs Reviewed - No data to display  Imaging Review No results found. I have personally reviewed and evaluated these images and lab results as part of my medical decision-making.   EKG Interpretation None      MDM   Final diagnoses:  None    1. Sinusitis  Patient does not appear ill. Her VS are unremarkable. Exam supports sinusitis, doubt bacterial as there is no sinus tenderness and there is no fever. Recommended supportive care.   I personally performed the services described in this documentation, which was scribed in my presence. The recorded information has been reviewed and is accurate.      Nancy Anis, PA-C 07/25/15 1610  Mancel Bale, MD 07/26/15 2142

## 2015-07-24 MED ORDER — TRIAMCINOLONE ACETONIDE 55 MCG/ACT NA AERO: 2.0000 | INHALATION_SPRAY | Freq: Every day | NASAL | Status: DC

## 2015-07-24 MED ORDER — IBUPROFEN 600 MG PO TABS: 600.0000 mg | ORAL_TABLET | Freq: Four times a day (QID) | ORAL | Status: AC | PRN

## 2015-07-24 NOTE — Discharge Instructions (Signed)
Sinus Rinse WHAT IS A SINUS RINSE? A sinus rinse is a simple home treatment that is used to rinse your sinuses with a sterile mixture of salt and water (saline solution). Sinuses are air-filled spaces in your skull behind the bones of your face and forehead that open into your nasal cavity. You will use the following:  Saline solution.  Neti pot or spray bottle. This releases the saline solution into your nose and through your sinuses. Neti pots and spray bottles can be purchased at your local pharmacy, a health food store, or online. WHEN WOULD I DO A SINUS RINSE? A sinus rinse can help to clear mucus, dirt, dust, or pollen from the nasal cavity. You may do a sinus rinse when you have a cold, a virus, nasal allergy symptoms, a sinus infection, or stuffiness in the nose or sinuses. If you are considering a sinus rinse:  Ask your child's health care provider before performing a sinus rinse on your child.  Do not do a sinus rinse if you have had ear or nasal surgery, ear infection, or blocked ears. HOW DO I DO A SINUS RINSE?  Wash your hands.  Disinfect your device according to the directions provided and then dry it.  Use the solution that comes with your device or one that is sold separately in stores. Follow the mixing directions on the package.  Fill your device with the amount of saline solution as directed by the device instructions.  Stand over a sink and tilt your head sideways over the sink.  Place the spout of the device in your upper nostril (the one closer to the ceiling).  Gently pour or squeeze the saline solution into the nasal cavity. The liquid should drain to the lower nostril if you are not overly congested.  Gently blow your nose. Blowing too hard may cause ear pain.  Repeat in the other nostril.  Clean and rinse your device with clean water and then air-dry it. ARE THERE RISKS OF A SINUS RINSE?  Sinus rinse is generally very safe and effective. However, there  are a few risks, which include:   A burning sensation in the sinuses. This may happen if you do not make the saline solution as directed. Make sure to follow all directions when making the saline solution.  Infection from contaminated water. This is rare, but possible.  Nasal irritation.   This information is not intended to replace advice given to you by your health care provider. Make sure you discuss any questions you have with your health care provider.   Document Released: 11/28/2013 Document Reviewed: 11/28/2013 Elsevier Interactive Patient Education 2016 Elsevier Inc.  Sinusitis, Adult Sinusitis is redness, soreness, and inflammation of the paranasal sinuses. Paranasal sinuses are air pockets within the bones of your face. They are located beneath your eyes, in the middle of your forehead, and above your eyes. In healthy paranasal sinuses, mucus is able to drain out, and air is able to circulate through them by way of your nose. However, when your paranasal sinuses are inflamed, mucus and air can become trapped. This can allow bacteria and other germs to grow and cause infection. Sinusitis can develop quickly and last only a short time (acute) or continue over a long period (chronic). Sinusitis that lasts for more than 12 weeks is considered chronic. CAUSES Causes of sinusitis include:  Allergies.  Structural abnormalities, such as displacement of the cartilage that separates your nostrils (deviated septum), which can decrease the air flow   through your nose and sinuses and affect sinus drainage.  Functional abnormalities, such as when the small hairs (cilia) that line your sinuses and help remove mucus do not work properly or are not present. SIGNS AND SYMPTOMS Symptoms of acute and chronic sinusitis are the same. The primary symptoms are pain and pressure around the affected sinuses. Other symptoms include:  Upper toothache.  Earache.  Headache.  Bad breath.  Decreased  sense of smell and taste.  A cough, which worsens when you are lying flat.  Fatigue.  Fever.  Thick drainage from your nose, which often is green and may contain pus (purulent).  Swelling and warmth over the affected sinuses. DIAGNOSIS Your health care provider will perform a physical exam. During your exam, your health care provider may perform any of the following to help determine if you have acute sinusitis or chronic sinusitis:  Look in your nose for signs of abnormal growths in your nostrils (nasal polyps).  Tap over the affected sinus to check for signs of infection.  View the inside of your sinuses using an imaging device that has a light attached (endoscope). If your health care provider suspects that you have chronic sinusitis, one or more of the following tests may be recommended:  Allergy tests.  Nasal culture. A sample of mucus is taken from your nose, sent to a lab, and screened for bacteria.  Nasal cytology. A sample of mucus is taken from your nose and examined by your health care provider to determine if your sinusitis is related to an allergy. TREATMENT Most cases of acute sinusitis are related to a viral infection and will resolve on their own within 10 days. Sometimes, medicines are prescribed to help relieve symptoms of both acute and chronic sinusitis. These may include pain medicines, decongestants, nasal steroid sprays, or saline sprays. However, for sinusitis related to a bacterial infection, your health care provider will prescribe antibiotic medicines. These are medicines that will help kill the bacteria causing the infection. Rarely, sinusitis is caused by a fungal infection. In these cases, your health care provider will prescribe antifungal medicine. For some cases of chronic sinusitis, surgery is needed. Generally, these are cases in which sinusitis recurs more than 3 times per year, despite other treatments. HOME CARE INSTRUCTIONS  Drink plenty of  water. Water helps thin the mucus so your sinuses can drain more easily.  Use a humidifier.  Inhale steam 3-4 times a day (for example, sit in the bathroom with the shower running).  Apply a warm, moist washcloth to your face 3-4 times a day, or as directed by your health care provider.  Use saline nasal sprays to help moisten and clean your sinuses.  Take medicines only as directed by your health care provider.  If you were prescribed either an antibiotic or antifungal medicine, finish it all even if you start to feel better. SEEK IMMEDIATE MEDICAL CARE IF:  You have increasing pain or severe headaches.  You have nausea, vomiting, or drowsiness.  You have swelling around your face.  You have vision problems.  You have a stiff neck.  You have difficulty breathing.   This information is not intended to replace advice given to you by your health care provider. Make sure you discuss any questions you have with your health care provider.   Document Released: 05/03/2005 Document Revised: 05/24/2014 Document Reviewed: 05/18/2011 Elsevier Interactive Patient Education 2016 Elsevier Inc.  

## 2015-07-24 NOTE — ED Notes (Signed)
Patient was alert, oriented and stable upon discharge. RN went over AVS and patient had no further questions.  

## 2015-07-31 ENCOUNTER — Encounter (HOSPITAL_COMMUNITY): Payer: Self-pay

## 2015-07-31 ENCOUNTER — Emergency Department (HOSPITAL_COMMUNITY)
Admission: EM | Admit: 2015-07-31 | Discharge: 2015-07-31 | Disposition: A | Discharge status: Home / Self Care | Payer: Medicaid Other | Attending: Emergency Medicine | Admitting: Emergency Medicine

## 2015-07-31 DIAGNOSIS — F17210 Nicotine dependence, cigarettes, uncomplicated: Secondary | ICD-10-CM | POA: Diagnosis not present

## 2015-07-31 DIAGNOSIS — R6884 Jaw pain: Secondary | ICD-10-CM | POA: Diagnosis not present

## 2015-07-31 DIAGNOSIS — K029 Dental caries, unspecified: Secondary | ICD-10-CM | POA: Insufficient documentation

## 2015-07-31 DIAGNOSIS — Z791 Long term (current) use of non-steroidal anti-inflammatories (NSAID): Secondary | ICD-10-CM | POA: Diagnosis not present

## 2015-07-31 DIAGNOSIS — Z91040 Latex allergy status: Secondary | ICD-10-CM | POA: Insufficient documentation

## 2015-07-31 DIAGNOSIS — K0889 Other specified disorders of teeth and supporting structures: Secondary | ICD-10-CM | POA: Diagnosis not present

## 2015-07-31 DIAGNOSIS — K002 Abnormalities of size and form of teeth: Secondary | ICD-10-CM | POA: Insufficient documentation

## 2015-07-31 DIAGNOSIS — J45909 Unspecified asthma, uncomplicated: Secondary | ICD-10-CM | POA: Diagnosis not present

## 2015-07-31 DIAGNOSIS — R070 Pain in throat: Secondary | ICD-10-CM | POA: Diagnosis present

## 2015-07-31 DIAGNOSIS — Z7951 Long term (current) use of inhaled steroids: Secondary | ICD-10-CM | POA: Diagnosis not present

## 2015-07-31 MED ORDER — PENICILLIN V POTASSIUM 500 MG PO TABS: 500.0000 mg | ORAL_TABLET | Freq: Three times a day (TID) | ORAL | Status: DC

## 2015-07-31 NOTE — ED Notes (Signed)
Pt here with c/o pain and swelling to right jaw area, worse with palpation. She also reports right ear "pressure" and sinus drainage. She denies dental pain.

## 2015-07-31 NOTE — Discharge Instructions (Signed)
Penicillin as prescribed.  Follow-up with dentistry in the next few days for a recheck.   [employer] [facility] [employer] 211 is a great source of information about community services available.  Access by dialing 2-1-1 from anywhere in [place], or by website -  PooledIncome.plwww.nc211.org.   Other Local Resources (Updated 05/2015)  Dental  Care   Services    Phone Number and Address  Cost  [place] [facility] For children 200 - 33 years of age:   Cleaning  Tooth brushing/flossing instruction  Sealants, fillings, crowns  Extractions  Emergency treatment  [phone] [address] [place], [place] [address] Charges based on family income.  Medicaid and some insurance plans accepted.     [facility]  Cleaning  Sealants, fillings, crowns  Extractions  Emergency treatment [phone] [address] [place], [place]  Pregnant women 33 years of age or older with a Medicaid card  [facility] [place]  Cleaning  Sealants, fillings, crowns  Extractions  Emergency treatment [phone] [address] [place], [place] Pregnant women 33 years of age or older with a Medicaid card  [facility] For children 330 - 33 years of age:   Cleaning  Tooth brushing/flossing instruction  Sealants, fillings, crowns  Extractions  Emergency treatment Limited orthodontic services for patients with Medicaid [phone] [address] [place], [place] [address] Medicaid and [facility] Health Choice cover for children up to age 33 and pregnant women.  Parents of children up to age 33 without Medicaid pay a reduced fee at time of service.  [facility] [place] For children 400 - 33 years of age:   Cleaning  Tooth brushing/flossing instruction  Sealants, fillings,  crowns  Extractions  Emergency treatment Limited orthodontic services for patients with Medicaid [phone] [address] [place], [place].  Medicaid and [place] Health Choice cover for children up to age 33 and pregnant women.  Parents of children up to age 33 without Medicaid pay a reduced fee.  [facility]  Cleaning  Sealants, fillings, crowns  Extractions  Hours: Tuesdays and Thursdays, 4:15 - 8 pm [phone] [address], [address] [place], [place] [address] Services free of charge to [facility] residents ages 18-64 who do not have health insurance, Medicare, [place], or [place] benefits and fall within federal poverty guidelines  [facility]    Provides dental care in addition to primary medical care, nutritional counseling, and pharmacy:  Nurse, mental healthCleaning  Sealants, fillings, crowns  Extractions                  [phone] [facility], [address] [place], [place]  [phone] [facility], [address] [place], [place]  [phone] [facility] [place], [place]  [phone] [facility], [address] [place], [place]  [phone] [facility] [address] [place], [place] Accepts [place], [place]are, most insurance.  Also provides services available to all with fees adjusted based on ability to pay.    [facility]  Cleaning  Tooth brushing/flossing instruction  Sealants, fillings, crowns  Extractions  Emergency treatment Hours: Tuesdays, Thursdays, and Fridays from 8 am to 5 pm by appointment only. [phone] [address] [place], [place] [phone] [facility]  residents with Medicaid (depending on eligibility) and children with [place] Health Choice - call for more information.  [facility]  Extractions only  Hours: 2nd and  4th Thursday of each month from 6:30 am - 9 am.   [phone] [place], [place] [address] Ages 51 and older only.  Patients are seen on a first come, first served basis.  [employer]  [employer]  Extractions  Orthodontics  Endodontics  Implants/Crowns/Bridges  Complete and partial dentures [phone] [place], [place] Patients must complete an application for services.  There is often a waiting list.    Dental Care and Dentist Visits Dental care supports good overall health. Regular dental visits can also help you avoid dental pain, bleeding, infection, and other more serious health problems in the future. It is important to keep the mouth healthy because diseases in the teeth, gums, and other oral tissues can spread to other areas of the body. Some problems, such as diabetes, heart disease, and pre-term labor have been associated with poor oral health.  See your dentist every 6 months. If you experience emergency problems such as a toothache or broken tooth, go to the dentist right away. If you see your dentist regularly, you may catch problems early. It is easier to be treated for problems in the early stages.  WHAT TO EXPECT AT A DENTIST VISIT  Your dentist will look for many common oral health problems and recommend proper treatment. At your regular dental visit, you can expect:  Gentle cleaning of the teeth and gums. This includes scraping and polishing. This helps to remove the sticky substance around the teeth and gums (plaque). Plaque forms in the mouth shortly after eating. Over time, plaque hardens on the teeth as tartar. If tartar is not removed regularly, it can cause problems. Cleaning also helps remove stains.  Periodic X-rays. These pictures of the teeth and supporting bone will help your dentist assess the health of your teeth.  Periodic fluoride treatments. Fluoride is a natural mineral shown to help strengthen teeth.  Fluoride treatmentinvolves applying a fluoride gel or varnish to the teeth. It is most commonly done in children.  Examination of the mouth, tongue, jaws, teeth, and gums to look for any oral health problems, such as:  Cavities (dental caries). This is decay on the tooth caused by plaque, sugar, and acid in the mouth. It is best to catch a cavity when it is small.  Inflammation of the gums caused by plaque buildup (gingivitis).  Problems with the mouth or malformed or misaligned teeth.  Oral cancer or other diseases of the soft tissues or jaws. KEEP YOUR TEETH AND GUMS HEALTHY For healthy teeth and gums, follow these general guidelines as well as your dentist's specific advice:  Have your teeth professionally cleaned at the dentist every 6 months.  Brush twice daily with a fluoride toothpaste.  Floss your teeth daily.  Ask your dentist if you need fluoride supplements, treatments, or fluoride toothpaste.  Eat a healthy diet. Reduce foods and drinks with added sugar.  Avoid smoking. TREATMENT FOR ORAL HEALTH PROBLEMS If you have oral health problems, treatment varies depending on the conditions present in your teeth and gums.  Your caregiver will most likely recommend good oral hygiene at each visit.  For cavities, gingivitis, or other oral health disease, your caregiver will perform a procedure to treat the problem. This is typically done at a separate appointment. Sometimes your caregiver will  refer you to another dental specialist for specific tooth problems or for surgery. SEEK IMMEDIATE DENTAL CARE IF:  You have pain, bleeding, or soreness in the gum, tooth, jaw, or mouth area.  A permanent tooth becomes loose or separated from the gum socket.  You experience a blow or injury to the mouth or jaw area.   This information is not intended to replace advice given to you by your health care provider. Make sure you discuss any questions you have with your health care  provider.   Document Released: 01/13/2011 Document Revised: 07/26/2011 Document Reviewed: 01/13/2011 Elsevier Interactive Patient Education [employer]

## 2015-07-31 NOTE — ED Provider Notes (Signed)
CSN: [id number]     Arrival date & time 07/31/15  1845 History   First MD Initiated Contact with Patient 07/31/15 2147     Chief Complaint  Patient presents with  . Throat pain      (Consider location/radiation/quality/duration/timing/severity/associated sxs/prior Treatment) HPI Comments: Patient is a 35 year old female who presents with complaints of right jaw pain and facial swelling which has worsened over the past several days. She was seen here earlier this week and was told she likely had a viral infection. Her pain and swelling are now worsening. She denies fevers or chills. She denies difficulty eating or swallowing.  The history is provided by the patient.    Past Medical History  Diagnosis Date  . Asthma    History reviewed. No pertinent past surgical history. Family History  Problem Relation Age of Onset  . Diabetes Other    Social History  Substance Use Topics  . Smoking status: Current Every Day Smoker -- 0.50 packs/day    Types: Cigarettes  . Smokeless tobacco: None  . Alcohol Use: No   OB History    Gravida Para Term Preterm AB TAB SAB Ectopic Multiple Living   Review of Systems  All other systems reviewed and are negative.     Allergies  Diphenhydramine hcl; Food color red; Latex; and Vicodin  Home Medications   Prior to Admission medications   Medication Sig Start Date End Date Taking? Authorizing Provider  ibuprofen (ADVIL,MOTRIN) 600 MG tablet Take 1 tablet (600 mg total) by mouth every 6 (six) hours as needed. 07/24/15   Elpidio Anis, PA-C  medroxyPROGESTERone (DEPO-PROVERA) 150 MG/ML injection INJECT INTRAMUSCULARLY EVERY 12 WEEKS FOR CONTRACEPTION 07/08/14   Brock Bad, MD  MedroxyPROGESTERone Acetate 150 MG/ML SUSY INJECT INTRAMUSCULARLY EVERY 12 WEEKS FOR CONTRACEPTION 03/30/15   Brock Bad, MD  methocarbamol (ROBAXIN) 500 MG tablet Take 1 tablet (500 mg total) by mouth 2 (two) times daily as needed for muscle  spasms. 06/13/15   Chase Picket Ward, PA-C  naproxen (NAPROSYN) 500 MG tablet Take 1 tablet (500 mg total) by mouth 2 (two) times daily. 06/13/15   Jaime Pilcher Ward, PA-C  naproxen sodium (ANAPROX DS) 550 MG tablet Take 1 tablet (550 mg total) by mouth 2 (two) times daily with a meal. 03/04/14   Brock Bad, MD  triamcinolone (NASACORT AQ) 55 MCG/ACT AERO nasal inhaler Place 2 sprays into the nose daily. 07/24/15   Shari Upstill, PA-C   BP 111/81 mmHg  Pulse 85  Temp(Src) 99 F (37.2 C) (Oral)  Resp 16  SpO2 100% Physical Exam  Constitutional: She is oriented to person, place, and time. She appears well-developed and well-nourished. No distress.  HENT:  Head: Normocephalic and atraumatic.  Mouth/Throat: Oropharynx is clear and moist.  There is poor dentition throughout. There are heavily decayed right lower molars present.  Neck: Normal range of motion. Neck supple.  Cardiovascular: Normal rate, regular rhythm and normal heart sounds.   No murmur heard. Pulmonary/Chest: Effort normal and breath sounds normal. No respiratory distress. She has no wheezes. She has no rales.  Abdominal: Soft. Bowel sounds are normal. She exhibits no distension. There is no tenderness.  Lymphadenopathy:    She has no cervical adenopathy.  Neurological: She is alert and oriented to person, place, and time.  Skin: Skin is warm and dry. She is not diaphoretic.  Nursing note and vitals reviewed.  ED Course  Procedures (including critical care time) Labs Review Labs Reviewed - No data to display  Imaging Review No results found. I have personally reviewed and evaluated these images and lab results as part of my medical decision-making.   EKG Interpretation None      MDM   Final diagnoses:  None    Patient has tenderness over her right lower jaw and heavily decayed dentition in this area. I suspect there is a dental etiology to this. She will be described antibiotics and advised to  follow-up with a dentist.    Geoffery Lyonsouglas Josedaniel Haye, MD 07/31/15 2154

## 2015-11-26 ENCOUNTER — Other Ambulatory Visit: Payer: Self-pay | Admitting: Obstetrics

## 2016-02-10 ENCOUNTER — Ambulatory Visit (INDEPENDENT_AMBULATORY_CARE_PROVIDER_SITE_OTHER): Payer: Medicaid Other | Admitting: Obstetrics

## 2016-02-10 ENCOUNTER — Encounter: Payer: Self-pay | Admitting: Obstetrics

## 2016-02-10 VITALS — BP 104/71 | HR 101 | Temp 98.0°F | Ht 61.0 in | Wt 213.0 lb

## 2016-02-10 DIAGNOSIS — Z01419 Encounter for gynecological examination (general) (routine) without abnormal findings: Secondary | ICD-10-CM | POA: Diagnosis not present

## 2016-02-10 DIAGNOSIS — Z30013 Encounter for initial prescription of injectable contraceptive: Secondary | ICD-10-CM

## 2016-02-10 DIAGNOSIS — Z0000 Encounter for general adult medical examination without abnormal findings: Secondary | ICD-10-CM

## 2016-02-10 DIAGNOSIS — Z113 Encounter for screening for infections with a predominantly sexual mode of transmission: Secondary | ICD-10-CM

## 2016-02-10 DIAGNOSIS — Z124 Encounter for screening for malignant neoplasm of cervix: Secondary | ICD-10-CM

## 2016-02-10 DIAGNOSIS — Z3202 Encounter for pregnancy test, result negative: Secondary | ICD-10-CM

## 2016-02-10 DIAGNOSIS — Z3009 Encounter for other general counseling and advice on contraception: Secondary | ICD-10-CM

## 2016-02-10 LAB — POCT URINE PREGNANCY: Preg Test, Ur: NEGATIVE

## 2016-02-10 NOTE — Progress Notes (Signed)
Patient ID: Nancy Hartman, female   DOB: 03/20/1981, 35 y.o.   MRN: [id number]   Subjective:        Nancy Hartman is a 35 y.o. female here for a routine exam.  Current complaints: None.    Personal health questionnaire:  Is patient Ashkenazi Jewish, have a family history of breast and/or ovarian cancer: no Is there a family history of uterine cancer diagnosed at age < 3250, gastrointestinal cancer, urinary tract cancer, family member who is a Personnel officerLynch syndrome-associated carrier: no Is the patient overweight and hypertensive, family history of diabetes, personal history of gestational diabetes, preeclampsia or PCOS: no Is patient over 5855, have PCOS,  family history of premature CHD under age 335, diabetes, smoke, have hypertension or peripheral artery disease:  no At any time, has a partner hit, kicked or otherwise hurt or frightened you?: no Over the past 2 weeks, have you felt down, depressed or hopeless?: no Over the past 2 weeks, have you felt little interest or pleasure in doing things?:no   Gynecologic History Patient's last menstrual period was 01/12/2016 (approximate). Contraception: condoms Last Pap: 2015. Results were: normal Last mammogram: n/a. Results were: n/a  Obstetric History OB History  Gravida Para Term Preterm AB Living  2 2 2     2   SAB TAB Ectopic Multiple Live Births          2    # Outcome Date GA Lbr Len/2nd Weight Sex Delivery Anes PTL Lv  2 Term     M Vag-Spont EPI  LIV  1 Term     F Vag-Spont EPI  LIV      Past Medical History:  Diagnosis Date  . Anxiety   . Asthma   . Bipolar 1 disorder (HCC)   . Depression     History reviewed. No pertinent surgical history.   Current Outpatient Prescriptions:  .  amphetamine-dextroamphetamine (ADDERALL) 30 MG tablet, Take 30 mg by mouth daily., Disp: , Rfl:  .  clonazePAM (KLONOPIN) 0.5 MG tablet, Take 0.5 mg by mouth 2 (two) times daily as needed for anxiety., Disp: , Rfl:  .  ibuprofen (ADVIL,MOTRIN) 600  MG tablet, Take 1 tablet (600 mg total) by mouth every 6 (six) hours as needed., Disp: 30 tablet, Rfl: 0 .  naproxen (NAPROSYN) 500 MG tablet, Take 1 tablet (500 mg total) by mouth 2 (two) times daily., Disp: 30 tablet, Rfl: 0 .  medroxyPROGESTERone (DEPO-PROVERA) 150 MG/ML injection, INJECT INTRAMUSCULARLY EVERY 12 WEEKS FOR CONTRACEPTION (Patient not taking: Reported on 02/10/2016), Disp: 1 mL, Rfl: 0 Allergies  Allergen Reactions  . Diphenhydramine Hcl Hives    Red dye in benadryl  . Food Color Red Hives  . Latex Hives  . Vicodin [Hydrocodone-Acetaminophen] Nausea And Vomiting    Social History  Substance Use Topics  . Smoking status: Current Every Day Smoker    Packs/day: 0.50    Types: Cigarettes  . Smokeless tobacco: Never Used  . Alcohol use No    Family History  Problem Relation Age of Onset  . Diabetes Other   . Heart disease Paternal Grandfather       Review of Systems  Constitutional: negative for fatigue and weight loss Respiratory: negative for cough and wheezing Cardiovascular: negative for chest pain, fatigue and palpitations Gastrointestinal: negative for abdominal pain and change in bowel habits Musculoskeletal:negative for myalgias Neurological: negative for gait problems and tremors Behavioral/Psych: negative for abusive relationship, depression Endocrine: negative for temperature intolerance   Genitourinary:negative  for abnormal menstrual periods, genital lesions, hot flashes, sexual problems and vaginal discharge Integument/breast: negative for breast lump, breast tenderness, nipple discharge and skin lesion(s)    Objective:       BP 104/71   Pulse (!) 101   Temp 98 F (36.7 C)   Ht 5\' 1"  (1.549 m)   Wt 213 lb (96.6 kg)   LMP 01/12/2016 (Approximate) Comment: Patient has been off Depo- has had light spotting  BMI 40.25 kg/m  General:   alert  Skin:   no rash or abnormalities  Lungs:   clear to auscultation bilaterally  Heart:   regular rate  and rhythm, S1, S2 normal, no murmur, click, rub or gallop  Breasts:   normal without suspicious masses, skin or nipple changes or axillary nodes  Abdomen:  normal findings: no organomegaly, soft, non-tender and no hernia  Pelvis:  External genitalia: normal general appearance Urinary system: urethral meatus normal and bladder without fullness, nontender Vaginal: normal without tenderness, induration or masses Cervix: normal appearance Adnexa: normal bimanual exam Uterus: anteverted and non-tender, normal size   Lab Review Urine pregnancy test Labs reviewed yes Radiologic studies reviewed no  50% of 20 min visit spent on counseling and coordination of care.   Assessment:    Healthy female exam.    Contraceptive counseling and advice.   Plan:    Education reviewed: calcium supplements, depression evaluation, safe sex/STD prevention, self breast exams and weight bearing exercise. Contraception: Depo-Provera injections. Follow up in: 1 year.   Meds ordered this encounter  Medications  . amphetamine-dextroamphetamine (ADDERALL) 30 MG tablet    Sig: Take 30 mg by mouth daily.  . clonazePAM (KLONOPIN) 0.5 MG tablet    Sig: Take 0.5 mg by mouth 2 (two) times daily as needed for anxiety.   Orders Placed This Encounter  Procedures  . NuSwab Vaginitis Plus (VG+)  . POCT urine pregnancy

## 2016-02-12 ENCOUNTER — Other Ambulatory Visit: Payer: Self-pay | Admitting: Obstetrics

## 2016-02-12 MED ORDER — MEDROXYPROGESTERONE ACETATE 150 MG/ML IM SUSP: INTRAMUSCULAR | 3 refills | Status: DC

## 2016-02-12 NOTE — Addendum Note (Signed)
Addended by: Coral CeoHARPER, Sruti Ayllon A on: 02/12/2016 01:40 PM   Modules accepted: Orders

## 2016-02-13 LAB — PAP IG AND HPV HIGH-RISK
HPV, high-risk: NEGATIVE
PAP SMEAR COMMENT: 0

## 2016-02-13 LAB — NUSWAB VAGINITIS PLUS (VG+)
CANDIDA ALBICANS, NAA: NEGATIVE
CANDIDA GLABRATA, NAA: NEGATIVE
Chlamydia trachomatis, NAA: NEGATIVE
NEISSERIA GONORRHOEAE, NAA: NEGATIVE
Trich vag by NAA: NEGATIVE

## 2016-04-05 ENCOUNTER — Ambulatory Visit
Admission: RE | Admit: 2016-04-05 | Discharge: 2016-04-05 | Disposition: A | Discharge status: Home / Self Care | Payer: Medicaid Other | Source: Ambulatory Visit | Attending: Physician Assistant | Admitting: Physician Assistant

## 2016-04-05 ENCOUNTER — Other Ambulatory Visit: Payer: Self-pay | Admitting: Physician Assistant

## 2016-04-05 DIAGNOSIS — M5412 Radiculopathy, cervical region: Secondary | ICD-10-CM

## 2017-07-19 ENCOUNTER — Other Ambulatory Visit: Payer: Self-pay

## 2017-07-19 ENCOUNTER — Encounter: Payer: Self-pay | Admitting: Obstetrics

## 2017-07-19 ENCOUNTER — Ambulatory Visit (INDEPENDENT_AMBULATORY_CARE_PROVIDER_SITE_OTHER): Payer: BLUE CROSS/BLUE SHIELD | Admitting: Obstetrics

## 2017-07-19 ENCOUNTER — Other Ambulatory Visit (HOSPITAL_COMMUNITY)
Admission: RE | Admit: 2017-07-19 | Discharge: 2017-07-19 | Disposition: A | Discharge status: Home / Self Care | Payer: BLUE CROSS/BLUE SHIELD | Source: Ambulatory Visit | Attending: Obstetrics | Admitting: Obstetrics

## 2017-07-19 VITALS — BP 107/71 | HR 103 | Ht 61.0 in | Wt 214.0 lb

## 2017-07-19 DIAGNOSIS — Z3202 Encounter for pregnancy test, result negative: Secondary | ICD-10-CM | POA: Diagnosis not present

## 2017-07-19 DIAGNOSIS — Z3042 Encounter for surveillance of injectable contraceptive: Secondary | ICD-10-CM

## 2017-07-19 DIAGNOSIS — R3121 Asymptomatic microscopic hematuria: Secondary | ICD-10-CM

## 2017-07-19 DIAGNOSIS — Z01419 Encounter for gynecological examination (general) (routine) without abnormal findings: Secondary | ICD-10-CM

## 2017-07-19 DIAGNOSIS — N898 Other specified noninflammatory disorders of vagina: Secondary | ICD-10-CM | POA: Diagnosis not present

## 2017-07-19 DIAGNOSIS — F17210 Nicotine dependence, cigarettes, uncomplicated: Secondary | ICD-10-CM

## 2017-07-19 LAB — POCT URINALYSIS DIPSTICK
GLUCOSE UA: NEGATIVE
Protein, UA: 30
Spec Grav, UA: 1.02 (ref 1.010–1.025)
Urobilinogen, UA: 0.2 E.U./dL
pH, UA: 6 (ref 5.0–8.0)

## 2017-07-19 LAB — POCT URINE PREGNANCY: Preg Test, Ur: NEGATIVE

## 2017-07-19 MED ORDER — MEDROXYPROGESTERONE ACETATE 150 MG/ML IM SUSP: 150.0000 mg | INTRAMUSCULAR | 4 refills | Status: DC

## 2017-07-19 NOTE — Progress Notes (Signed)
Subjective:        Nancy Hartman is a 37 y.o. female here for a routine exam.  Current complaints: None.    Personal health questionnaire:  Is patient Ashkenazi Jewish, have a family history of breast and/or ovarian cancer: no Is there a family history of uterine cancer diagnosed at age < 7950, gastrointestinal cancer, urinary tract cancer, family member who is a Personnel officerLynch syndrome-associated carrier: no Is the patient overweight and hypertensive, family history of diabetes, personal history of gestational diabetes, preeclampsia or PCOS: no Is patient over 6055, have PCOS,  family history of premature CHD under age 35, diabetes, smoke, have hypertension or peripheral artery disease:  no At any time, has a partner hit, kicked or otherwise hurt or frightened you?: no Over the past 2 weeks, have you felt down, depressed or hopeless?: no Over the past 2 weeks, have you felt little interest or pleasure in doing things?:no   Gynecologic History No LMP recorded (lmp unknown). Patient has had an injection. Contraception: condoms Last Pap: 2017. Results were: normal Last mammogram: n/a. Results were: n/a  Obstetric History OB History  Gravida Para Term Preterm AB Living  2 2 2     2   SAB TAB Ectopic Multiple Live Births          2    # Outcome Date GA Lbr Len/2nd Weight Sex Delivery Anes PTL Lv  2 Term     M Vag-Spont EPI  LIV  1 Term     F Vag-Spont EPI  LIV      Past Medical History:  Diagnosis Date  . Anxiety   . Asthma   . Bipolar 1 disorder (HCC)   . Depression     History reviewed. No pertinent surgical history.   Current Outpatient Medications:  .  amphetamine-dextroamphetamine (ADDERALL) 30 MG tablet, Take 30 mg by mouth daily., Disp: , Rfl:  .  clonazePAM (KLONOPIN) 0.5 MG tablet, Take 0.5 mg by mouth 2 (two) times daily as needed for anxiety., Disp: , Rfl:  .  ibuprofen (ADVIL,MOTRIN) 600 MG tablet, Take 1 tablet (600 mg total) by mouth every 6 (six) hours as needed.,  Disp: 30 tablet, Rfl: 0 .  medroxyPROGESTERone (DEPO-PROVERA) 150 MG/ML injection, INJECT INTRAMUSCULARLY EVERY 12 WEEKS FOR CONTRACEPTION, Disp: 1 mL, Rfl: 3 .  medroxyPROGESTERone (DEPO-PROVERA) 150 MG/ML injection, Inject 1 mL (150 mg total) into the muscle every 3 (three) months., Disp: 1 mL, Rfl: 4 .  MedroxyPROGESTERone Acetate 150 MG/ML SUSY, INJECT INTRAMUSCULARLY EVERY 12 WEEKS FOR CONTRACEPTION, Disp: 1 Syringe, Rfl: 1 .  naproxen (NAPROSYN) 500 MG tablet, Take 1 tablet (500 mg total) by mouth 2 (two) times daily., Disp: 30 tablet, Rfl: 0 Allergies  Allergen Reactions  . Diphenhydramine Hcl Hives    Red dye in benadryl  . Food Color Red Hives  . Latex Hives  . Vicodin [Hydrocodone-Acetaminophen] Nausea And Vomiting    Social History   Tobacco Use  . Smoking status: Current Every Day Smoker    Packs/day: 0.50    Types: Cigarettes  . Smokeless tobacco: Never Used  Substance Use Topics  . Alcohol use: No    Family History  Problem Relation Age of Onset  . Diabetes Other   . Heart disease Paternal Grandfather       Review of Systems  Constitutional: negative for fatigue and weight loss Respiratory: negative for cough and wheezing Cardiovascular: negative for chest pain, fatigue and palpitations Gastrointestinal: negative for abdominal pain and  change in bowel habits Musculoskeletal:negative for myalgias Neurological: negative for gait problems and tremors Behavioral/Psych: negative for abusive relationship, depression Endocrine: negative for temperature intolerance    Genitourinary:negative for abnormal menstrual periods, genital lesions, hot flashes, sexual problems and vaginal discharge Integument/breast: negative for breast lump, breast tenderness, nipple discharge and skin lesion(s)    Objective:       BP 107/71   Pulse (!) 103   Ht 5\' 1"  (1.549 m)   Wt 214 lb (97.1 kg)   LMP  (LMP Unknown)   BMI 40.43 kg/m  General:   alert  Skin:   no rash or  abnormalities  Lungs:   clear to auscultation bilaterally  Heart:   regular rate and rhythm, S1, S2 normal, no murmur, click, rub or gallop  Breasts:   normal without suspicious masses, skin or nipple changes or axillary nodes  Abdomen:  normal findings: no organomegaly, soft, non-tender and no hernia  Pelvis:  External genitalia: normal general appearance Urinary system: urethral meatus normal and bladder without fullness, nontender Vaginal: normal without tenderness, induration or masses Cervix: normal appearance Adnexa: normal bimanual exam Uterus: anteverted and non-tender, normal size   Lab Review Urine pregnancy test Labs reviewed yes Radiologic studies reviewed no  50% of 20 min visit spent on counseling and coordination of care.   Assessment:    1. Encounter for gynecological examination with Papanicolaou smear of cervix Rx: - Cytology - PAP - POCT urine pregnancy - POCT Urinalysis Dipstick  2. Vaginal discharge Rx: - Cervicovaginal ancillary only  3. Encounter for surveillance of injectable contraceptive Rx: - medroxyPROGESTERone (DEPO-PROVERA) 150 MG/ML injection; Inject 1 mL (150 mg total) into the muscle every 3 (three) months.  Dispense: 1 mL; Refill: 4  4. Tobacco dependence due to cigarettes - smoking cessation recommended - [facility] brochure dispensed   5. Asymptomatic microscopic hematuria Rx: - Urine Culture   Plan:    Education reviewed: calcium supplements, depression evaluation, low fat, low cholesterol diet, safe sex/STD prevention, self breast exams, smoking cessation and weight bearing exercise. Contraception: Depo-Provera injections. Follow up in: 1 year.   Meds ordered this encounter  Medications  . medroxyPROGESTERone (DEPO-PROVERA) 150 MG/ML injection    Sig: Inject 1 mL (150 mg total) into the muscle every 3 (three) months.    Dispense:  1 mL    Refill:  4   Orders Placed This Encounter  Procedures  . Urine Culture  . POCT urine  pregnancy  . POCT Urinalysis Dipstick    Brock Bad MD

## 2017-07-19 NOTE — Progress Notes (Signed)
Presents for AEX/PAP/Cultures.  Needs DEPO RX. Last DEPO more than I year ago.  UPT today is NEGATIVE. Urine has strong odor.

## 2017-07-20 ENCOUNTER — Ambulatory Visit (INDEPENDENT_AMBULATORY_CARE_PROVIDER_SITE_OTHER): Payer: BLUE CROSS/BLUE SHIELD

## 2017-07-20 DIAGNOSIS — Z3042 Encounter for surveillance of injectable contraceptive: Secondary | ICD-10-CM | POA: Diagnosis not present

## 2017-07-20 LAB — CYTOLOGY - PAP
Diagnosis: NEGATIVE
HPV: NOT DETECTED

## 2017-07-20 LAB — CERVICOVAGINAL ANCILLARY ONLY
Bacterial vaginitis: NEGATIVE
CANDIDA VAGINITIS: POSITIVE — AB
CHLAMYDIA, DNA PROBE: NEGATIVE
NEISSERIA GONORRHEA: NEGATIVE
TRICH (WINDOWPATH): NEGATIVE

## 2017-07-20 MED ORDER — MEDROXYPROGESTERONE ACETATE 150 MG/ML IM SUSP
150.0000 mg | INTRAMUSCULAR | Status: DC
  Administered 2017-07-20 – 2017-10-11 (×2): 150 mg via INTRAMUSCULAR

## 2017-07-20 NOTE — Progress Notes (Signed)
Agree with A & P. 

## 2017-07-20 NOTE — Progress Notes (Signed)
Pt here for depo shot. Inj given in left upper outer quad. Pt tolerated well. Next depo is due 5/22-6/5

## 2017-07-21 ENCOUNTER — Other Ambulatory Visit: Payer: Self-pay | Admitting: Obstetrics

## 2017-07-21 DIAGNOSIS — B9620 Unspecified Escherichia coli [E. coli] as the cause of diseases classified elsewhere: Secondary | ICD-10-CM

## 2017-07-21 DIAGNOSIS — B3731 Acute candidiasis of vulva and vagina: Secondary | ICD-10-CM

## 2017-07-21 DIAGNOSIS — N390 Urinary tract infection, site not specified: Principal | ICD-10-CM

## 2017-07-21 DIAGNOSIS — B373 Candidiasis of vulva and vagina: Secondary | ICD-10-CM

## 2017-07-21 LAB — URINE CULTURE

## 2017-07-21 MED ORDER — FLUCONAZOLE 150 MG PO TABS: 150.0000 mg | ORAL_TABLET | Freq: Once | ORAL | 0 refills | Status: AC

## 2017-07-21 MED ORDER — NITROFURANTOIN MONOHYD MACRO 100 MG PO CAPS: 100.0000 mg | ORAL_CAPSULE | Freq: Two times a day (BID) | ORAL | 0 refills | Status: AC

## 2017-10-11 ENCOUNTER — Ambulatory Visit (INDEPENDENT_AMBULATORY_CARE_PROVIDER_SITE_OTHER): Payer: BLUE CROSS/BLUE SHIELD

## 2017-10-11 DIAGNOSIS — Z3042 Encounter for surveillance of injectable contraceptive: Secondary | ICD-10-CM | POA: Diagnosis not present

## 2017-10-11 NOTE — Progress Notes (Signed)
Patient is in the office for depo injection, administered and pt tolerated well .Marland Kitchen Administrations This Visit    medroxyPROGESTERone (DEPO-PROVERA) injection 150 mg    Admin Date 10/11/2017 Action Given Dose 150 mg Route Intramuscular Administered By Katrina Stack, RN

## 2017-10-11 NOTE — Progress Notes (Signed)
I have reviewed the chart and agree with nursing staff's documentation of this patient's encounter.  Catalina Antigua, MD 10/11/2017 10:27 AM

## 2018-01-03 ENCOUNTER — Ambulatory Visit: Payer: BLUE CROSS/BLUE SHIELD

## 2018-09-14 ENCOUNTER — Other Ambulatory Visit: Payer: Self-pay | Admitting: Obstetrics

## 2018-09-14 DIAGNOSIS — Z3042 Encounter for surveillance of injectable contraceptive: Secondary | ICD-10-CM

## 2019-08-25 ENCOUNTER — Ambulatory Visit: Payer: BC Managed Care – PPO | Attending: Internal Medicine

## 2019-08-25 DIAGNOSIS — Z23 Encounter for immunization: Secondary | ICD-10-CM

## 2019-08-25 NOTE — Progress Notes (Signed)
   Covid-19 Vaccination Clinic  Name:  Nancy Hartman    MRN: [id number] DOB: 1980/12/24  08/25/2019  [name] was observed post Covid-19 immunization for 15 minutes without incident. She was provided with Vaccine Information Sheet and instruction to access the V-Safe system.   [name] was instructed to call 911 with any severe reactions post vaccine: Marland Kitchen Difficulty breathing  . Swelling of face and throat  . A fast heartbeat  . A bad rash all over body  . Dizziness and weakness   Immunizations Administered    Name Date Dose VIS Date Route   Pfizer COVID-19 Vaccine 08/25/2019  4:50 PM 0.3 mL 04/27/2019 Intramuscular   Manufacturer: [employer], [employer]   Lot: [id number]   NDC: [address]-4

## 2019-09-18 ENCOUNTER — Ambulatory Visit: Payer: BC Managed Care – PPO | Attending: Internal Medicine

## 2019-09-18 DIAGNOSIS — Z23 Encounter for immunization: Secondary | ICD-10-CM

## 2019-09-18 NOTE — Progress Notes (Signed)
   Covid-19 Vaccination Clinic  Name:  KINDA POTTLE    MRN: [id number] DOB: 05-Nov-1980  09/18/2019  Ms. Desmith was observed post Covid-19 immunization for 15 minutes without incident. She was provided with Vaccine Information Sheet and instruction to access the V-Safe system.   Ms. Curl was instructed to call 911 with any severe reactions post vaccine: Marland Kitchen Difficulty breathing  . Swelling of face and throat  . A fast heartbeat  . A bad rash all over body  . Dizziness and weakness   Immunizations Administered    Name Date Dose VIS Date Route   Pfizer COVID-19 Vaccine 09/18/2019  8:51 AM 0.3 mL 07/11/2018 Intramuscular   Manufacturer: [employer], [employer]   Lot: [id number]   NDC: [address]-0

## 2019-10-01 ENCOUNTER — Ambulatory Visit: Payer: BC Managed Care – PPO | Admitting: Obstetrics

## 2019-10-02 ENCOUNTER — Other Ambulatory Visit (HOSPITAL_COMMUNITY)
Admission: RE | Admit: 2019-10-02 | Discharge: 2019-10-02 | Disposition: A | Discharge status: Home / Self Care | Payer: BC Managed Care – PPO | Source: Ambulatory Visit | Attending: Obstetrics | Admitting: Obstetrics

## 2019-10-02 ENCOUNTER — Ambulatory Visit (INDEPENDENT_AMBULATORY_CARE_PROVIDER_SITE_OTHER): Payer: BC Managed Care – PPO | Admitting: Obstetrics

## 2019-10-02 ENCOUNTER — Encounter: Payer: Self-pay | Admitting: Obstetrics

## 2019-10-02 VITALS — BP 135/81 | HR 97 | Wt 241.0 lb

## 2019-10-02 DIAGNOSIS — N898 Other specified noninflammatory disorders of vagina: Secondary | ICD-10-CM | POA: Insufficient documentation

## 2019-10-02 DIAGNOSIS — Z3009 Encounter for other general counseling and advice on contraception: Secondary | ICD-10-CM

## 2019-10-02 DIAGNOSIS — Z01419 Encounter for gynecological examination (general) (routine) without abnormal findings: Secondary | ICD-10-CM

## 2019-10-02 DIAGNOSIS — F17210 Nicotine dependence, cigarettes, uncomplicated: Secondary | ICD-10-CM

## 2019-10-02 DIAGNOSIS — Z6841 Body Mass Index (BMI) 40.0 and over, adult: Secondary | ICD-10-CM

## 2019-10-02 NOTE — Progress Notes (Signed)
Subjective:        Nancy Hartman is a 39 y.o. female here for a routine exam.  Current complaints: None.    Personal health questionnaire:  Is patient Ashkenazi Jewish, have a family history of breast and/or ovarian cancer: no Is there a family history of uterine cancer diagnosed at age < 15, gastrointestinal cancer, urinary tract cancer, family member who is a Personnel officer syndrome-associated carrier: no Is the patient overweight and hypertensive, family history of diabetes, personal history of gestational diabetes, preeclampsia or PCOS: yes Is patient over 66, have PCOS,  family history of premature CHD under age 60, diabetes, smoke, have hypertension or peripheral artery disease:  no At any time, has a partner hit, kicked or otherwise hurt or frightened you?: no Over the past 2 weeks, have you felt down, depressed or hopeless?: no Over the past 2 weeks, have you felt little interest or pleasure in doing things?:no   Gynecologic History No LMP recorded. Patient has had an injection. Contraception: none Last Pap: 2019. Results were: normal Last mammogram: n/a. Results were: n/a  Obstetric History OB History  Gravida Para Term Preterm AB Living  2 2 2     2   SAB TAB Ectopic Multiple Live Births          2    # Outcome Date GA Lbr Len/2nd Weight Sex Delivery Anes PTL Lv  2 Term     M Vag-Spont EPI  LIV  1 Term     F Vag-Spont EPI  LIV    Past Medical History:  Diagnosis Date  . Anxiety   . Asthma   . Bipolar 1 disorder (HCC)   . Depression     History reviewed. No pertinent surgical history.   Current Outpatient Medications:  .  amphetamine-dextroamphetamine (ADDERALL) 30 MG tablet, Take 30 mg by mouth daily., Disp: , Rfl:  .  clonazePAM (KLONOPIN) 0.5 MG tablet, Take 0.5 mg by mouth 2 (two) times daily as needed for anxiety., Disp: , Rfl:  .  ibuprofen (ADVIL,MOTRIN) 600 MG tablet, Take 1 tablet (600 mg total) by mouth every 6 (six) hours as needed., Disp: 30 tablet, Rfl:  0 .  medroxyPROGESTERone (DEPO-PROVERA) 150 MG/ML injection, INJECT INTRAMUSCULARLY EVERY 12 WEEKS FOR CONTRACEPTION, Disp: 1 mL, Rfl: 3 .  MedroxyPROGESTERone Acetate 150 MG/ML SUSY, INJECT INTRAMUSCULARLY EVERY 12 WEEKS FOR CONTRACEPTION, Disp: 1 Syringe, Rfl: 1 .  medroxyPROGESTERone Acetate 150 MG/ML SUSY, INJECT 1 ML IN THE MUSCLE EVERY 3 MONTHS, Disp: 1 mL, Rfl: 3 .  naproxen (NAPROSYN) 500 MG tablet, Take 1 tablet (500 mg total) by mouth 2 (two) times daily., Disp: 30 tablet, Rfl: 0 .  nitrofurantoin, macrocrystal-monohydrate, (MACROBID) 100 MG capsule, Take 1 capsule (100 mg total) by mouth 2 (two) times daily., Disp: 14 capsule, Rfl: 0  Current Facility-Administered Medications:  .  medroxyPROGESTERone (DEPO-PROVERA) injection 150 mg, 150 mg, Intramuscular, Q90 days, M, MD, 150 mg at 10/11/17 0820 Allergies  Allergen Reactions  . Diphenhydramine Hcl Hives    Red dye in benadryl  . Food Color Red Hives  . Latex Hives  . Vicodin [Hydrocodone-Acetaminophen] Nausea And Vomiting    Social History   Tobacco Use  . Smoking status: Current Every Day Smoker    Packs/day: 0.50    Types: Cigarettes  . Smokeless tobacco: Never Used  Substance Use Topics  . Alcohol use: No    Family History  Problem Relation Age of Onset  . Diabetes Other   .  Heart disease Paternal Grandfather       Review of Systems  Constitutional: negative for fatigue and weight loss Respiratory: negative for cough and wheezing Cardiovascular: negative for chest pain, fatigue and palpitations Gastrointestinal: negative for abdominal pain and change in bowel habits Musculoskeletal:negative for myalgias Neurological: negative for gait problems and tremors Behavioral/Psych: negative for abusive relationship, depression Endocrine: negative for temperature intolerance    Genitourinary:negative for abnormal menstrual periods, genital lesions, hot flashes, sexual problems and vaginal  discharge Integument/breast: negative for breast lump, breast tenderness, nipple discharge and skin lesion(s)    Objective:       BP 135/81   Pulse 97   Wt 241 lb (109.3 kg)   BMI 45.54 kg/m  General:   alert  Skin:   no rash or abnormalities  Lungs:   clear to auscultation bilaterally  Heart:   regular rate and rhythm, S1, S2 normal, no murmur, click, rub or gallop  Breasts:   normal without suspicious masses, skin or nipple changes or axillary nodes  Abdomen:  normal findings: no organomegaly, soft, non-tender and no hernia  Pelvis:  External genitalia: normal general appearance Urinary system: urethral meatus normal and bladder without fullness, nontender Vaginal: normal without tenderness, induration or masses Cervix: normal appearance Adnexa: normal bimanual exam Uterus: anteverted and non-tender, normal size   Lab Review Urine pregnancy test Labs reviewed yes Radiologic studies reviewed no  50% of 20 min visit spent on counseling and coordination of care.   Assessment:     1. Encounter for routine gynecological examination with Papanicolaou smear of cervix Rx: - Cytology - PAP  2. Vaginal discharge Rx: - Cervicovaginal ancillary only  3. Class 3 severe obesity due to excess calories without serious comorbidity with body mass index (BMI) of 45.0 to 49.9 in adult [facility]) - program of caloric reduction, exercise and behavioral modification recommended  4. Encounter for other general counseling and advice on contraception - considering the ParaGuard IUD.  Educational brochure dispensed. - discussed the fact that non-hormonal methods are safest for her with her tobacco dependence  5. Tobacco dependence due to cigarettes - cessation encouraged with the aid of medication and behavioral modification    Plan:    Education reviewed: calcium supplements, depression evaluation, low fat, low cholesterol diet, safe sex/STD prevention, self breast exams, skin cancer  screening, smoking cessation and weight bearing exercise. Contraception: considering options. Follow up in: 1 year.    Shelly Bombard, MD 10/02/2019 8:50 AM

## 2019-10-03 LAB — CERVICOVAGINAL ANCILLARY ONLY
Bacterial Vaginitis (gardnerella): NEGATIVE
Candida Glabrata: POSITIVE — AB
Candida Vaginitis: POSITIVE — AB
Chlamydia: NEGATIVE
Comment: NEGATIVE
Comment: NEGATIVE
Comment: NEGATIVE
Comment: NEGATIVE
Comment: NEGATIVE
Comment: NORMAL
Neisseria Gonorrhea: NEGATIVE
Trichomonas: NEGATIVE

## 2019-10-04 ENCOUNTER — Telehealth: Payer: Self-pay

## 2019-10-04 ENCOUNTER — Other Ambulatory Visit: Payer: Self-pay | Admitting: Obstetrics

## 2019-10-04 DIAGNOSIS — B3731 Acute candidiasis of vulva and vagina: Secondary | ICD-10-CM

## 2019-10-04 LAB — CYTOLOGY - PAP
Comment: NEGATIVE
Diagnosis: NEGATIVE
High risk HPV: NEGATIVE

## 2019-10-04 MED ORDER — TERCONAZOLE 0.4 % VA CREA: 1.0000 | TOPICAL_CREAM | Freq: Every day | VAGINAL | 0 refills | Status: DC

## 2019-10-04 NOTE — Telephone Encounter (Signed)
S/w pt and advised of results and rx sent. 

## 2021-03-19 ENCOUNTER — Encounter: Payer: Self-pay | Admitting: Obstetrics

## 2021-03-19 ENCOUNTER — Other Ambulatory Visit: Payer: Self-pay

## 2021-03-19 ENCOUNTER — Ambulatory Visit (INDEPENDENT_AMBULATORY_CARE_PROVIDER_SITE_OTHER): Payer: 59 | Admitting: Obstetrics

## 2021-03-19 ENCOUNTER — Other Ambulatory Visit (HOSPITAL_COMMUNITY)
Admission: RE | Admit: 2021-03-19 | Discharge: 2021-03-19 | Disposition: A | Discharge status: Home / Self Care | Payer: 59 | Source: Ambulatory Visit | Attending: Obstetrics | Admitting: Obstetrics

## 2021-03-19 VITALS — BP 112/78 | HR 85 | Ht 60.0 in | Wt 211.2 lb

## 2021-03-19 DIAGNOSIS — E66813 Obesity, class 3: Secondary | ICD-10-CM

## 2021-03-19 DIAGNOSIS — F17210 Nicotine dependence, cigarettes, uncomplicated: Secondary | ICD-10-CM | POA: Diagnosis not present

## 2021-03-19 DIAGNOSIS — Z6841 Body Mass Index (BMI) 40.0 and over, adult: Secondary | ICD-10-CM

## 2021-03-19 DIAGNOSIS — N898 Other specified noninflammatory disorders of vagina: Secondary | ICD-10-CM | POA: Diagnosis present

## 2021-03-19 DIAGNOSIS — Z01419 Encounter for gynecological examination (general) (routine) without abnormal findings: Secondary | ICD-10-CM | POA: Insufficient documentation

## 2021-03-19 LAB — POCT URINE PREGNANCY: Preg Test, Ur: NEGATIVE

## 2021-03-19 NOTE — Progress Notes (Signed)
Subjective:        Nancy Hartman is a 40 y.o. female here for a routine exam.  Current complaints: Vaginal Discharge.    Personal health questionnaire:  Is patient Ashkenazi Jewish, have a family history of breast and/or ovarian cancer: no Is there a family history of uterine cancer diagnosed at age < 60, gastrointestinal cancer, urinary tract cancer, family member who is a Personnel officer syndrome-associated carrier: no Is the patient overweight and hypertensive, family history of diabetes, personal history of gestational diabetes, preeclampsia or PCOS: no Is patient over 59, have PCOS,  family history of premature CHD under age 78, diabetes, smoke, have hypertension or peripheral artery disease:  no At any time, has a partner hit, kicked or otherwise hurt or frightened you?: no Over the past 2 weeks, have you felt down, depressed or hopeless?: no Over the past 2 weeks, have you felt little interest or pleasure in doing things?:no   Gynecologic History Patient's last menstrual period was 03/12/2021. Contraception: condoms Last Pap: 10-02-2019. Results were: normal Last mammogram: n/a. Results were: n/a  Obstetric History OB History  Gravida Para Term Preterm AB Living  2 2 2     2   SAB IAB Ectopic Multiple Live Births          2    # Outcome Date GA Lbr Len/2nd Weight Sex Delivery Anes PTL Lv  2 Term     M Vag-Spont EPI  LIV  1 Term     F Vag-Spont EPI  LIV    Past Medical History:  Diagnosis Date   Anxiety    Asthma    Bipolar 1 disorder (HCC)    Depression     History reviewed. No pertinent surgical history.   Current Outpatient Medications:    amphetamine-dextroamphetamine (ADDERALL) 30 MG tablet, Take 30 mg by mouth 2 (two) times daily., Disp: , Rfl:    cetirizine (ZYRTEC) 10 MG tablet, Take 10 mg by mouth daily., Disp: , Rfl:    clonazePAM (KLONOPIN) 0.5 MG tablet, Take 0.5 mg by mouth 2 (two) times daily as needed for anxiety. (Patient not taking: Reported on  03/19/2021), Disp: , Rfl:    ibuprofen (ADVIL,MOTRIN) 600 MG tablet, Take 1 tablet (600 mg total) by mouth every 6 (six) hours as needed. (Patient not taking: Reported on 03/19/2021), Disp: 30 tablet, Rfl: 0   medroxyPROGESTERone (DEPO-PROVERA) 150 MG/ML injection, INJECT INTRAMUSCULARLY EVERY 12 WEEKS FOR CONTRACEPTION (Patient not taking: Reported on 03/19/2021), Disp: 1 mL, Rfl: 3   MedroxyPROGESTERone Acetate 150 MG/ML SUSY, INJECT INTRAMUSCULARLY EVERY 12 WEEKS FOR CONTRACEPTION (Patient not taking: Reported on 03/19/2021), Disp: 1 Syringe, Rfl: 1   medroxyPROGESTERone Acetate 150 MG/ML SUSY, INJECT 1 ML IN THE MUSCLE EVERY 3 MONTHS (Patient not taking: Reported on 03/19/2021), Disp: 1 mL, Rfl: 3   naproxen (NAPROSYN) 500 MG tablet, Take 1 tablet (500 mg total) by mouth 2 (two) times daily. (Patient not taking: Reported on 03/19/2021), Disp: 30 tablet, Rfl: 0   nitrofurantoin, macrocrystal-monohydrate, (MACROBID) 100 MG capsule, Take 1 capsule (100 mg total) by mouth 2 (two) times daily. (Patient not taking: Reported on 03/19/2021), Disp: 14 capsule, Rfl: 0   terconazole (TERAZOL 7) 0.4 % vaginal cream, Place 1 applicator vaginally at bedtime. (Patient not taking: Reported on 03/19/2021), Disp: 45 g, Rfl: 0  Current Facility-Administered Medications:    medroxyPROGESTERone (DEPO-PROVERA) injection 150 mg, 150 mg, Intramuscular, Q90 days, 13/07/2020, MD, 150 mg at 10/11/17 0820 Allergies  Allergen Reactions  Diphenhydramine Hcl Hives    Red dye in benadryl   Food Color Red Hives   Latex Hives   Vicodin [Hydrocodone-Acetaminophen] Nausea And Vomiting    Social History   Tobacco Use   Smoking status: Every Day    Packs/day: 0.50    Types: Cigarettes   Smokeless tobacco: Never  Substance Use Topics   Alcohol use: No    Family History  Problem Relation Age of Onset   Diabetes Other    Heart disease Paternal Grandfather       Review of Systems  Constitutional: negative for  fatigue and weight loss Respiratory: negative for cough and wheezing Cardiovascular: negative for chest pain, fatigue and palpitations Gastrointestinal: negative for abdominal pain and change in bowel habits Musculoskeletal:negative for myalgias Neurological: negative for gait problems and tremors Behavioral/Psych: negative for abusive relationship, depression Endocrine: negative for temperature intolerance    Genitourinary: positive for vaginal discharge.  IN:[facility] for abnormal menstrual periods, genital lesions, hot flashes, sexual problems Integument/breast: negative for breast lump, breast tenderness, nipple discharge and skin lesion(s)    Objective:       BP 112/78   Pulse 85   Ht 5' (1.524 m)   Wt 211 lb 3.2 oz (95.8 kg)   LMP 03/12/2021   BMI 41.25 kg/m  General:   Alert and no distress  Skin:   no rash or abnormalities  Lungs:   clear to auscultation bilaterally  Heart:   regular rate and rhythm, S1, S2 normal, no murmur, click, rub or gallop  Breasts:   normal without suspicious masses, skin or nipple changes or axillary nodes  Abdomen:  normal findings: no organomegaly, soft, non-tender and no hernia  Pelvis:  External genitalia: normal general appearance Urinary system: urethral meatus normal and bladder without fullness, nontender Vaginal: normal without tenderness, induration or masses Cervix: normal appearance Adnexa: normal bimanual exam Uterus: anteverted and non-tender, normal size   Lab Review Urine pregnancy test Labs reviewed yes Radiologic studies reviewed no  I have spent a total of 20 minutes of face-to-face time, excluding clinical staff time, reviewing notes and preparing to see patient, ordering tests and/or medications, and counseling the patient.   Assessment:    1. Encounter for gynecological examination with Papanicolaou smear of cervix Rx: - POCT urine pregnancy - Cytology - PAP( [facility])  2. Vaginal discharge Rx: -  Cervicovaginal ancillary only  3. Class 3 severe obesity due to excess calories without serious comorbidity with body mass index (BMI) of 40.0 to 44.9 in adult (HCC) - weight reduction with the aid of dietary changes, exercise and behavioral modification recommended  4. Tobacco dependence due to cigarettes - cessation with the aid of medication and behavioral modification recommended    Plan:    Education reviewed: calcium supplements, depression evaluation, low fat, low cholesterol diet, safe sex/STD prevention, self breast exams, smoking cessation, and weight bearing exercise. Contraception: condoms. Follow up in: 1 year.    Orders Placed This Encounter  Procedures   POCT urine pregnancy     Brock Bad, MD 03/19/2021 6:32 PM

## 2021-03-19 NOTE — Progress Notes (Signed)
Pt was informed she could get an IUD and annual today.  Last IC this morning with a condom per pt.  UPT negative Requests a referral for a bondage dominic satanist therapist.

## 2021-03-20 ENCOUNTER — Other Ambulatory Visit: Payer: Self-pay | Admitting: Obstetrics

## 2021-03-20 DIAGNOSIS — B3731 Acute candidiasis of vulva and vagina: Secondary | ICD-10-CM

## 2021-03-20 LAB — CERVICOVAGINAL ANCILLARY ONLY
Bacterial Vaginitis (gardnerella): NEGATIVE
Candida Glabrata: NEGATIVE
Candida Vaginitis: POSITIVE — AB
Chlamydia: NEGATIVE
Comment: NEGATIVE
Comment: NEGATIVE
Comment: NEGATIVE
Comment: NEGATIVE
Comment: NEGATIVE
Comment: NORMAL
Neisseria Gonorrhea: NEGATIVE
Trichomonas: NEGATIVE

## 2021-03-20 MED ORDER — TERCONAZOLE 0.4 % VA CREA: 1.0000 | TOPICAL_CREAM | Freq: Every day | VAGINAL | 0 refills | Status: DC

## 2021-03-24 LAB — CYTOLOGY - PAP
Comment: NEGATIVE
Diagnosis: NEGATIVE
High risk HPV: NEGATIVE

## 2021-03-24 NOTE — Progress Notes (Signed)
TC to notify patient of yeast and RX. Patient verbalized understanding.

## 2021-04-07 ENCOUNTER — Other Ambulatory Visit: Payer: Self-pay

## 2021-04-07 ENCOUNTER — Encounter: Payer: Self-pay | Admitting: Obstetrics

## 2021-04-07 ENCOUNTER — Ambulatory Visit (INDEPENDENT_AMBULATORY_CARE_PROVIDER_SITE_OTHER): Payer: 59 | Admitting: Obstetrics

## 2021-04-07 VITALS — BP 131/84 | HR 78 | Wt 211.0 lb

## 2021-04-07 DIAGNOSIS — Z30430 Encounter for insertion of intrauterine contraceptive device: Secondary | ICD-10-CM

## 2021-04-07 DIAGNOSIS — B369 Superficial mycosis, unspecified: Secondary | ICD-10-CM

## 2021-04-07 DIAGNOSIS — B3731 Acute candidiasis of vulva and vagina: Secondary | ICD-10-CM

## 2021-04-07 LAB — POCT URINE PREGNANCY: Preg Test, Ur: NEGATIVE

## 2021-04-07 MED ORDER — LEVONORGESTREL 20 MCG/DAY IU IUD
1.0000 | INTRAUTERINE_SYSTEM | Freq: Once | INTRAUTERINE | Status: AC
Start: 2021-04-07 — End: 2021-04-07
  Administered 2021-04-07: 1 via INTRAUTERINE

## 2021-04-07 MED ORDER — CLOTRIMAZOLE 1 % EX CREA: 1.0000 "application " | TOPICAL_CREAM | Freq: Two times a day (BID) | CUTANEOUS | 2 refills | Status: AC

## 2021-04-07 MED ORDER — TERCONAZOLE 0.8 % VA CREA: 1.0000 | TOPICAL_CREAM | Freq: Every day | VAGINAL | 2 refills | Status: AC

## 2021-04-07 NOTE — Progress Notes (Addendum)
IUD Procedure Note   DIAGNOSIS: Desires long-term, reversible contraception   PROCEDURE: IUD placement Performing Provider: Bing Neighbors. Clearance Coots MD Patient counseled prior to procedure. I explained risks and benefits of Mirena IUD, reviewed alternative forms of contraception. Patient stated understanding and consented to continue with procedure.   LMP: 03-12-2021 Pregnancy Test: Negative Lot #: [id number] Expiration Date: FEB 2024   IUD type: [x]  Mirena   []  Paragard  []  Lyletta   []   Kyleena  PROCEDURE:  Timeout procedure was performed to ensure right patient and right site.  A bimanual exam was performed to determine the position of the uterus, midposition. The speculum was placed. The vagina and cervix was sterilized in the usual manner and sterile technique was maintained throughout the course of the procedure. A single toothed tenaculum was applied to the posterior lip of the cervix and gentle traction applied. The depth of the uterus was sounded to 6 cm. With gentle traction on the tenaculum, the IUD was inserted to the appropriate depth and inserted without difficulty.  The string was cut to an estimated 4 cm length. Bleeding was minimal. The patient tolerated the procedure well.   Follow up: The patient tolerated the procedure well without complications.  Standard post-procedure care is explained and return precautions are given.  , MD 04/07/2021 8:54 AM

## 2021-04-07 NOTE — Progress Notes (Signed)
Pt presents for Mirena IUD insertion.  Denies unprotected IC x 14 days.

## 2021-04-07 NOTE — Addendum Note (Signed)
Addended by: Coral Ceo A on: 04/07/2021 12:06 PM   Modules accepted: Orders

## 2021-05-21 ENCOUNTER — Encounter: Payer: Self-pay | Admitting: Obstetrics and Gynecology

## 2021-05-21 ENCOUNTER — Other Ambulatory Visit: Payer: Self-pay

## 2021-05-21 ENCOUNTER — Ambulatory Visit (INDEPENDENT_AMBULATORY_CARE_PROVIDER_SITE_OTHER): Payer: 59 | Admitting: Obstetrics and Gynecology

## 2021-05-21 VITALS — BP 116/77 | HR 92 | Wt 219.0 lb

## 2021-05-21 DIAGNOSIS — Z30431 Encounter for routine checking of intrauterine contraceptive device: Secondary | ICD-10-CM

## 2021-05-21 NOTE — Progress Notes (Signed)
Pt is doing well with IUD.

## 2021-05-21 NOTE — Progress Notes (Signed)
41 yo P2 here for IUD check. Patient had IUD inserted 03/2021. She reports doing well without complaints. She denies pelvic pain, abnormal bleeding or dyspareunia.   Past Medical History:  Diagnosis Date   Anxiety    Asthma    Bipolar 1 disorder (HCC)    Depression    History reviewed. No pertinent surgical history. Family History  Problem Relation Age of Onset   Diabetes Other    Heart disease Paternal Grandfather    Social History   Tobacco Use   Smoking status: Every Day    Packs/day: 0.50    Types: Cigarettes   Smokeless tobacco: Never  Substance Use Topics   Alcohol use: No   Drug use: No   ROS See pertinent in HPI. All other systems reviewed and non contributory  Blood pressure 116/77, pulse 92, weight 219 lb (99.3 kg). GENERAL: Well-developed, well-nourished female in no acute distress.  ABDOMEN: Soft, nontender, nondistended. No organomegaly. PELVIC: Normal external female genitalia. Vagina is pink and rugated.  Normal discharge. Normal appearing cervix with IUD strings extending 2 cm from os and curled into the posterior fornix. Uterus is normal in size. No adnexal mass or tenderness. Chaperone present during the pelvic exam EXTREMITIES: No cyanosis, clubbing, or edema, 2+ distal pulses.  A/P 41 yo here for IUD check - IUD appears to be in the appropriate location - Patient with normal pap smear 03/2021 - RTC in 1 year or prn

## 2023-12-30 DIAGNOSIS — Z6841 Body Mass Index (BMI) 40.0 and over, adult: Secondary | ICD-10-CM | POA: Diagnosis not present

## 2023-12-30 DIAGNOSIS — R634 Abnormal weight loss: Secondary | ICD-10-CM | POA: Diagnosis not present

## 2023-12-30 DIAGNOSIS — Z130 Encounter for screening for diseases of the blood and blood-forming organs and certain disorders involving the immune mechanism: Secondary | ICD-10-CM | POA: Diagnosis not present

## 2023-12-30 DIAGNOSIS — E66813 Obesity, class 3: Secondary | ICD-10-CM | POA: Diagnosis not present

## 2023-12-30 DIAGNOSIS — F900 Attention-deficit hyperactivity disorder, predominantly inattentive type: Secondary | ICD-10-CM | POA: Diagnosis not present
